# Patient Record
Sex: Female | Born: 1980 | Race: White | Hispanic: No | Marital: Married | Smoking: Never smoker
Health system: Southern US, Community
[De-identification: ages and names within clinical notes are randomized; demographics above are authoritative.]

## PROBLEM LIST (undated history)

## (undated) DIAGNOSIS — N83202 Unspecified ovarian cyst, left side: Secondary | ICD-10-CM

## (undated) DIAGNOSIS — F32A Depression, unspecified: Secondary | ICD-10-CM

## (undated) DIAGNOSIS — E669 Obesity, unspecified: Secondary | ICD-10-CM

## (undated) DIAGNOSIS — F329 Major depressive disorder, single episode, unspecified: Secondary | ICD-10-CM

## (undated) DIAGNOSIS — K219 Gastro-esophageal reflux disease without esophagitis: Secondary | ICD-10-CM

## (undated) HISTORY — DX: Gastro-esophageal reflux disease without esophagitis: K21.9

## (undated) HISTORY — DX: Unspecified ovarian cyst, left side: N83.202

## (undated) HISTORY — DX: Obesity, unspecified: E66.9

## (undated) HISTORY — DX: Depression, unspecified: F32.A

## (undated) HISTORY — DX: Major depressive disorder, single episode, unspecified: F32.9

---

## 2004-08-29 ENCOUNTER — Ambulatory Visit: Payer: Self-pay

## 2007-11-21 ENCOUNTER — Ambulatory Visit (HOSPITAL_COMMUNITY): Admission: RE | Admit: 2007-11-21 | Discharge: 2007-11-21 | Payer: Self-pay | Admitting: Obstetrics and Gynecology

## 2008-04-07 ENCOUNTER — Ambulatory Visit: Payer: Self-pay

## 2008-06-18 IMAGING — US US OB < 14 WEEKS
1 series · 14 of 21 positions shown · non-contrast
Comparison: none

REASON FOR EXAM: Dates Viablility Call Report [id number]
COMMENTS:

[Series 1: us ob < 14 weeks · 0.29mm/px · 14 of 21 slices shown]
[im 1/21]
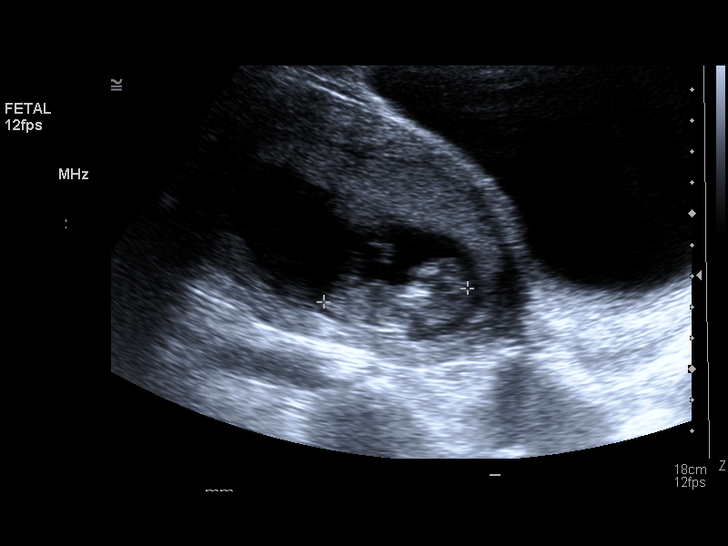
[im 3/21]
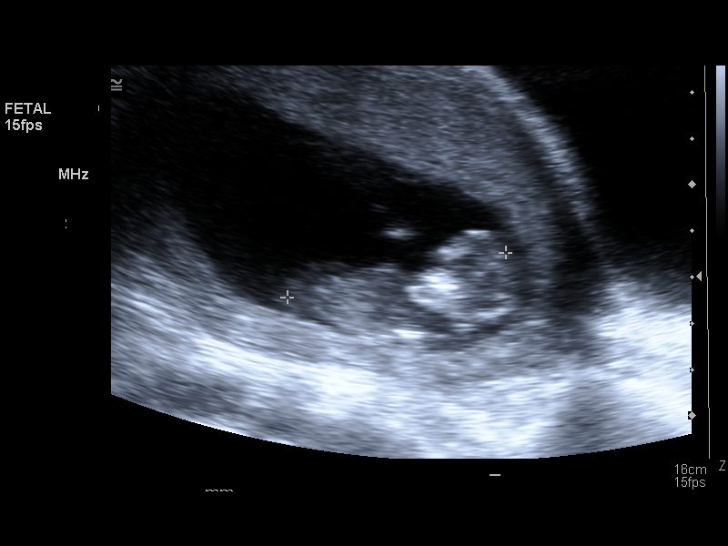
[im 4/21]
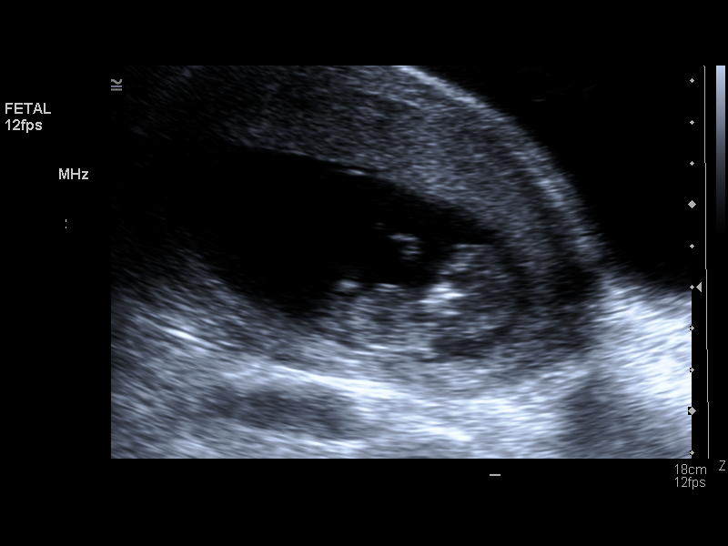
[im 6/21]
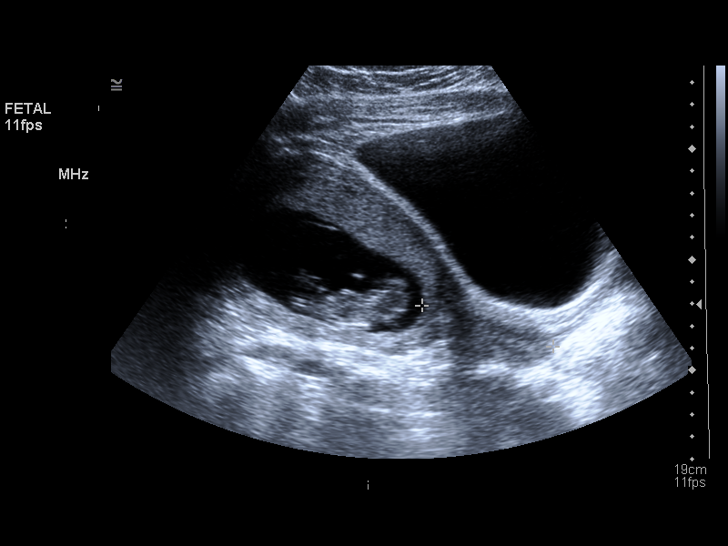
[im 7/21]
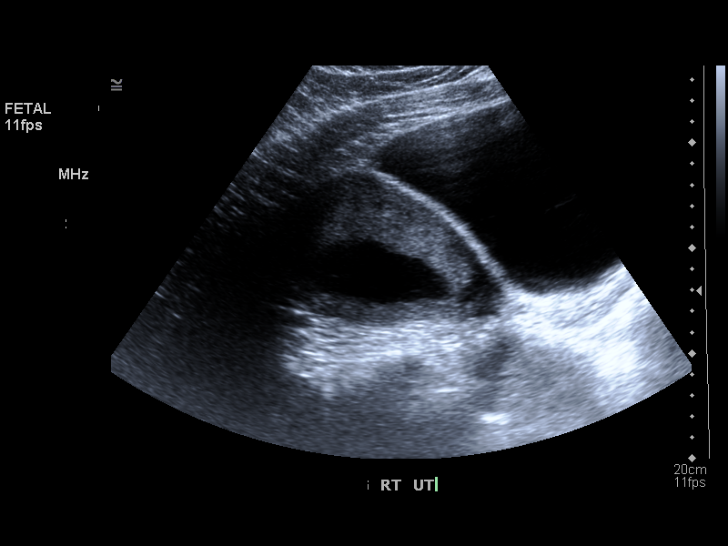
[im 9/21]
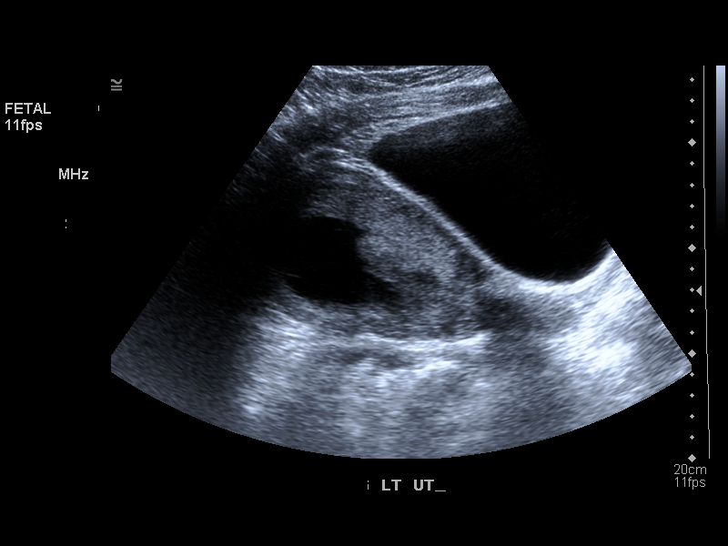
[im 10/21]
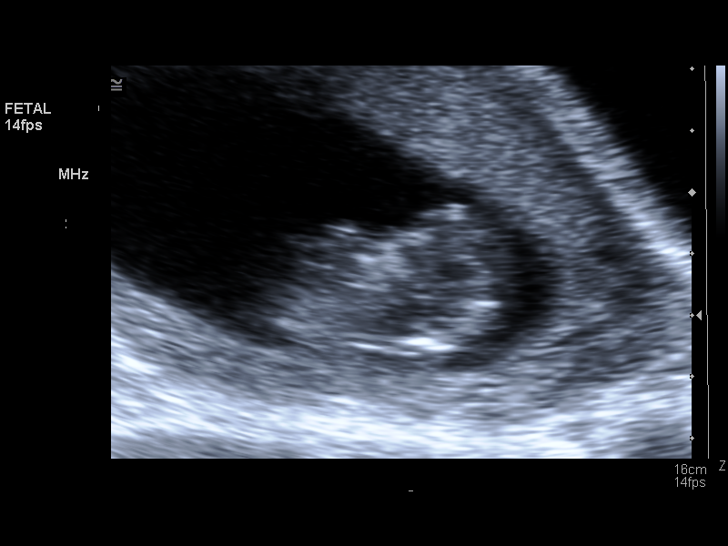
[im 12/21]
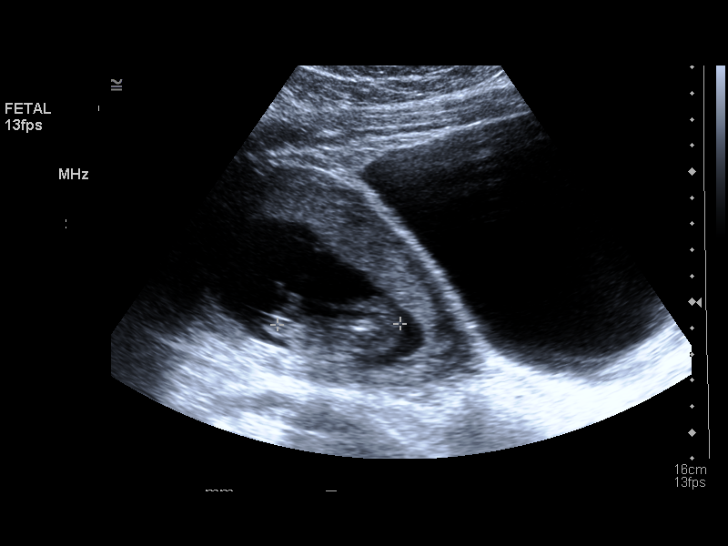
[im 13/21]
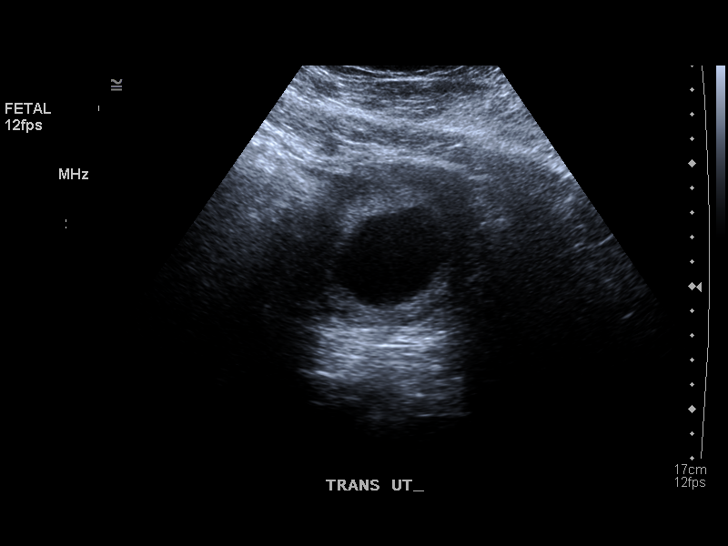
[im 15/21]
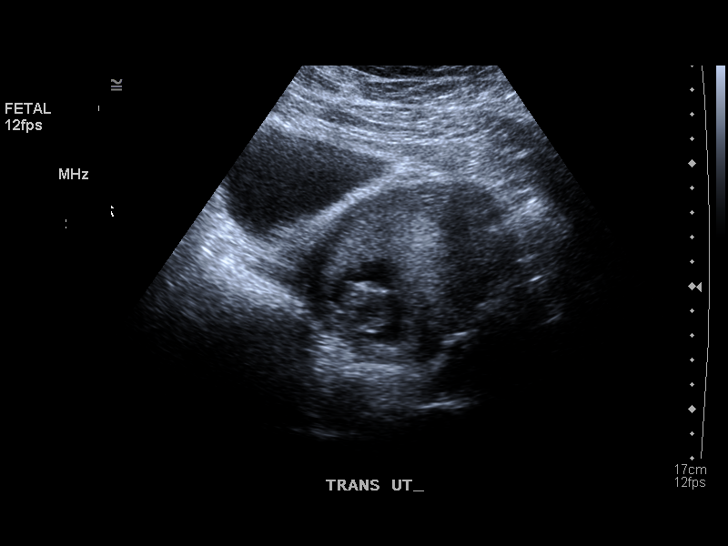
[im 16/21]
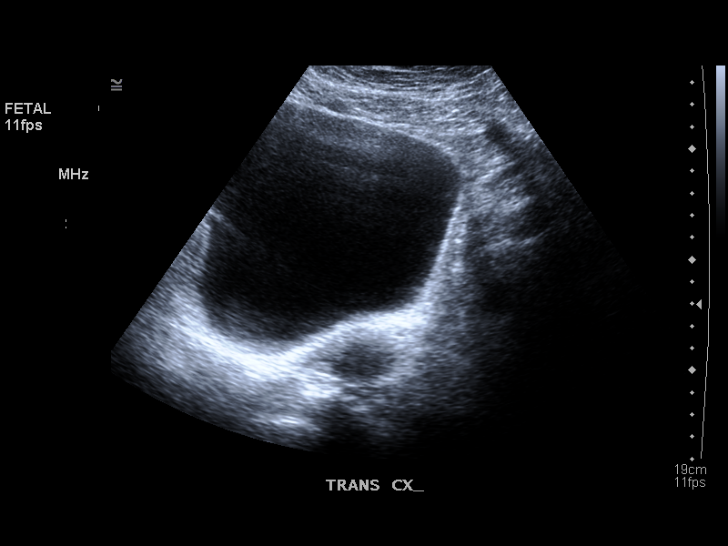
[im 18/21]
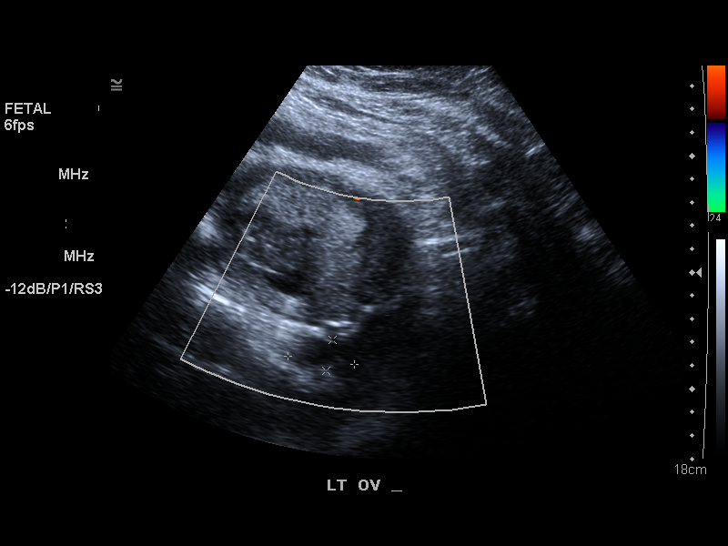
[im 19/21]
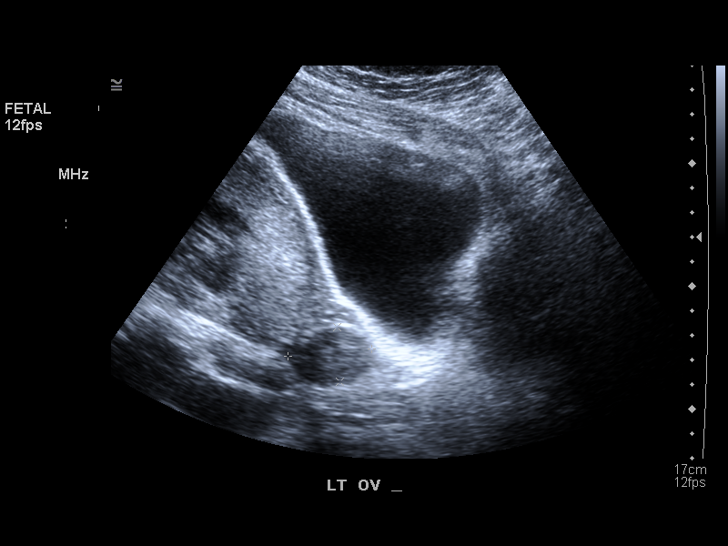
[im 21/21]
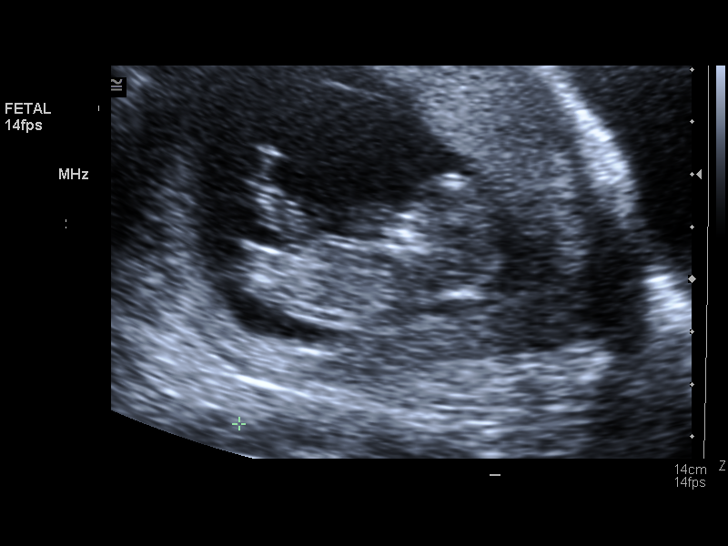

[14 of 21 positions shown; findings below may reference images not displayed]

PROCEDURE:     US  - US OB LESS THAN 14 WEEKS  - April 07, 2008  [DATE]

RESULT:     There is a gravid uterus present. The crown-rump length measures
47.3 cm corresponding to a gestational age of 66weeks-4 days. Fetal cardiac
activity with a rate of 163 beats per minute is seen. The ovaries are normal
in echotexture and contour.
IMPRESSION: There is a viable IUP with estimated gestational age of 11
weeks-3 days. The estimated date of confinement is 23 October, 2008. Cardiac
activity with a rate of 163 beats per minute was demonstrated.

This report was called to Ms. [HOSPITAL] at the conclusion of the
study.

## 2008-10-13 ENCOUNTER — Observation Stay: Payer: Self-pay | Admitting: Obstetrics and Gynecology

## 2008-10-15 ENCOUNTER — Observation Stay: Payer: Self-pay | Admitting: Obstetrics and Gynecology

## 2008-10-26 ENCOUNTER — Inpatient Hospital Stay: Payer: Self-pay

## 2010-08-24 ENCOUNTER — Ambulatory Visit: Payer: Self-pay | Admitting: Internal Medicine

## 2010-11-04 IMAGING — US US EXTREM LOW VENOUS*L*
1 series · 17 of 24 positions shown · non-contrast
Comparison: none

REASON FOR EXAM: cr  [id number]  left leg pain edema  eval for DVT
COMMENTS:

[Series 1: us extrem low venous*left* · 17 of 25 slices shown]
[im 1/25]
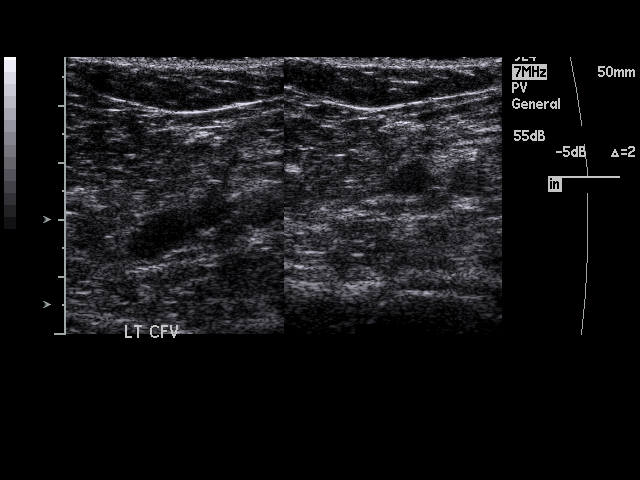
[im 3/25]
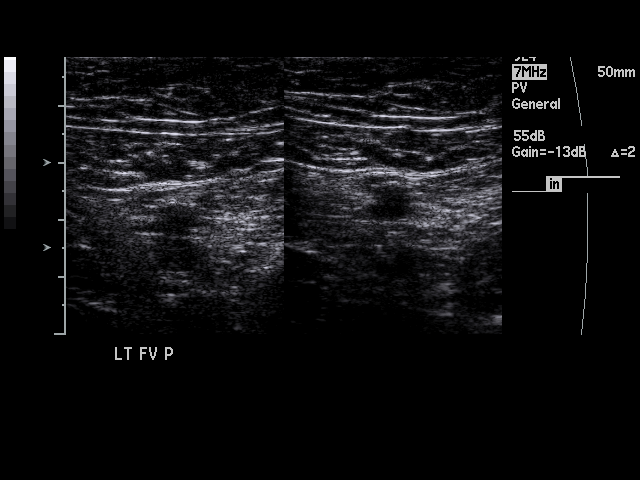
[im 4/25]
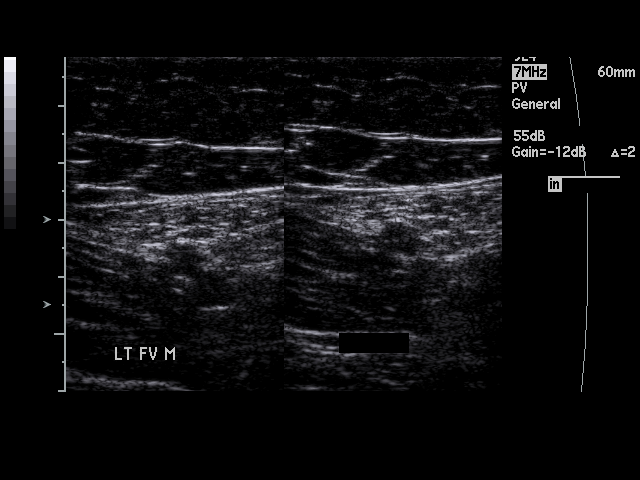
[im 5/25]
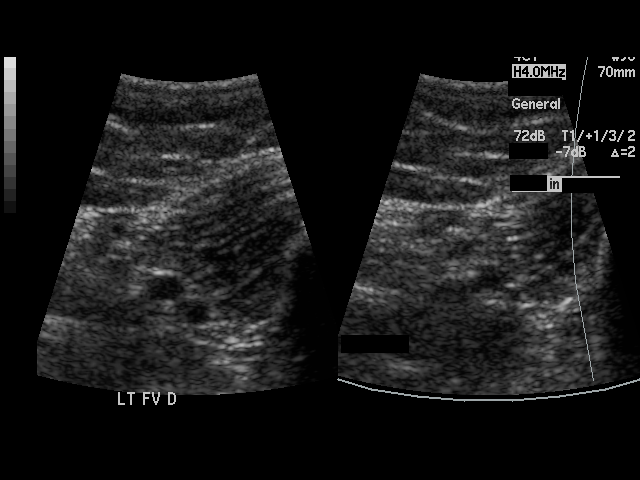
[im 7/25]
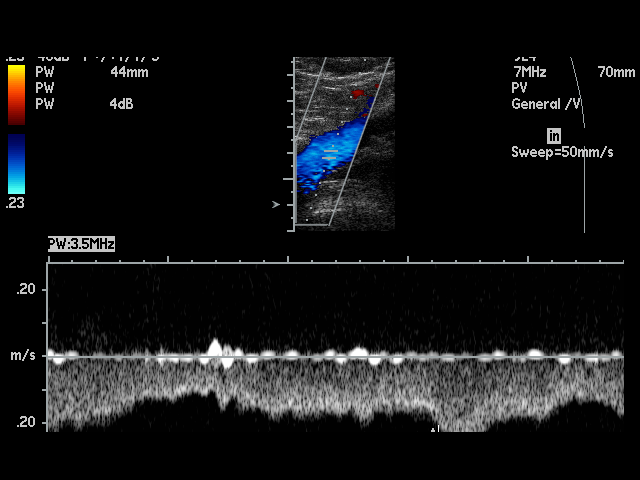
[im 8/25]
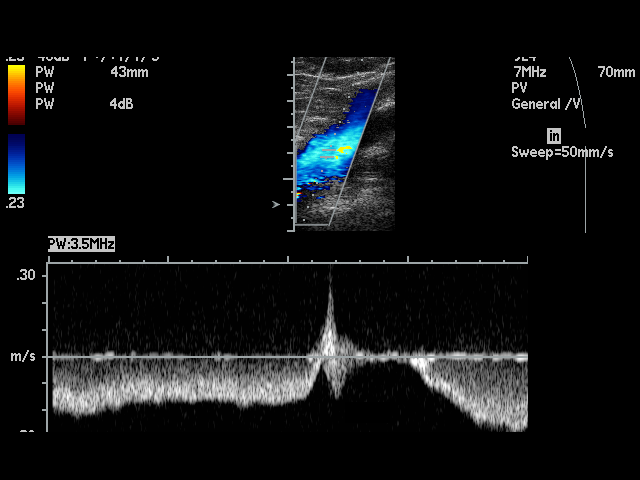
[im 10/25]
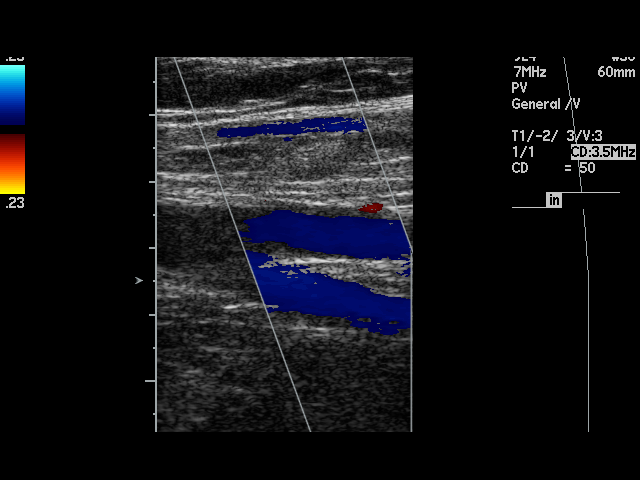
[im 11/25]
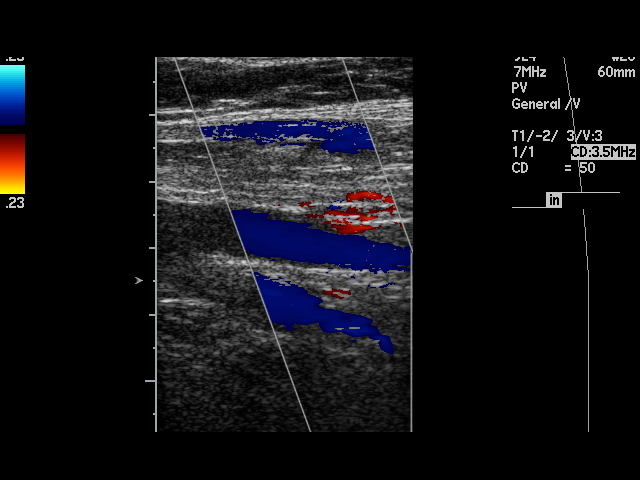
[im 13/25]
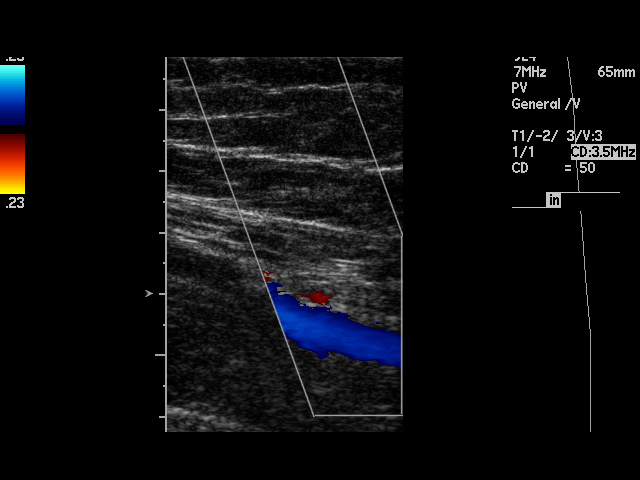
[im 14/25]
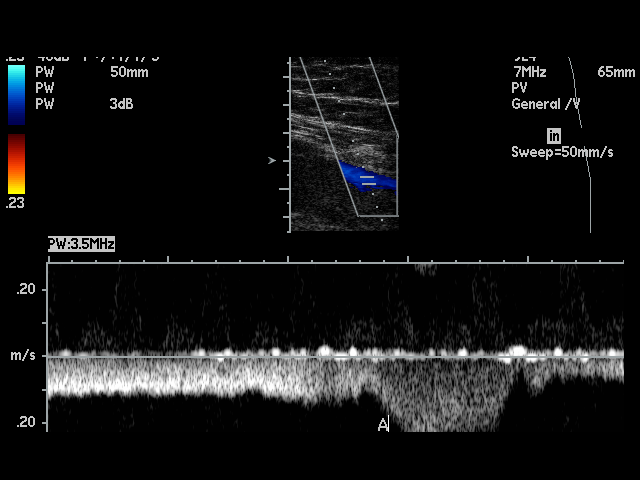
[im 15/25]
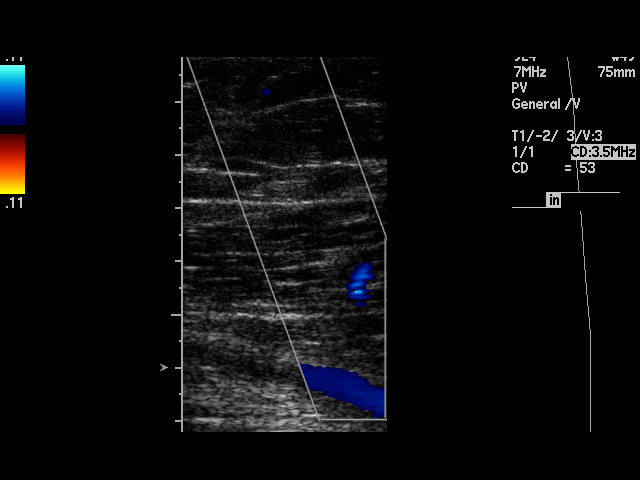
[im 17/25]
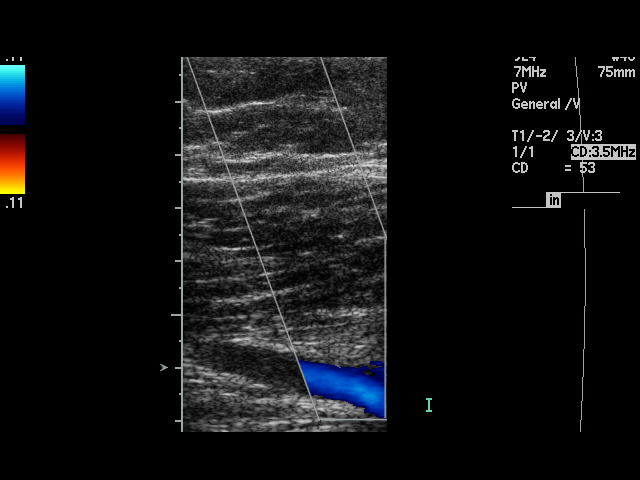
[im 18/25]
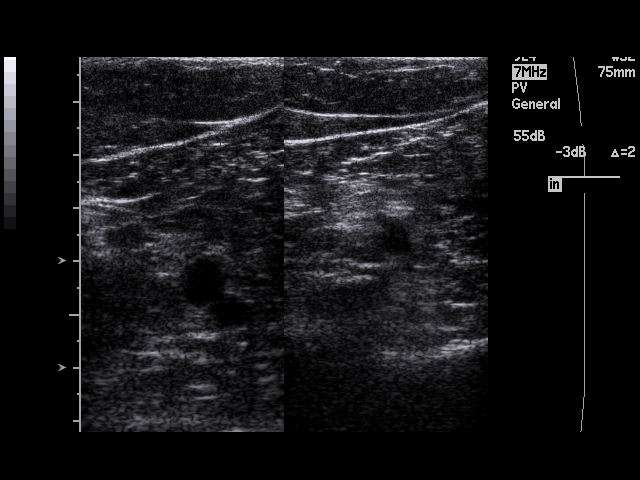
[im 20/25]
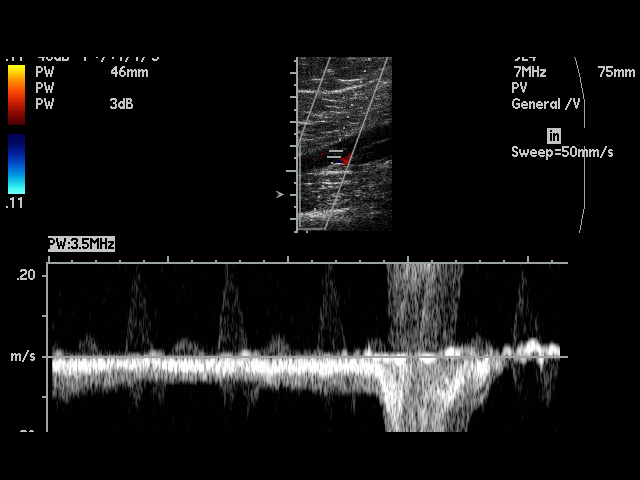
[im 21/25]
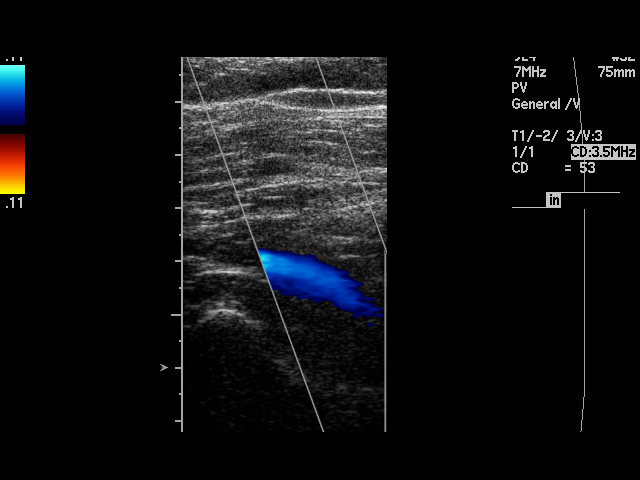
[im 22/25]
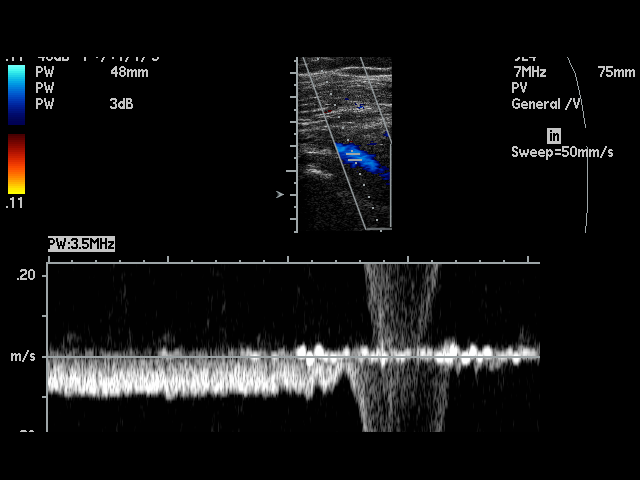
[im 25/25]
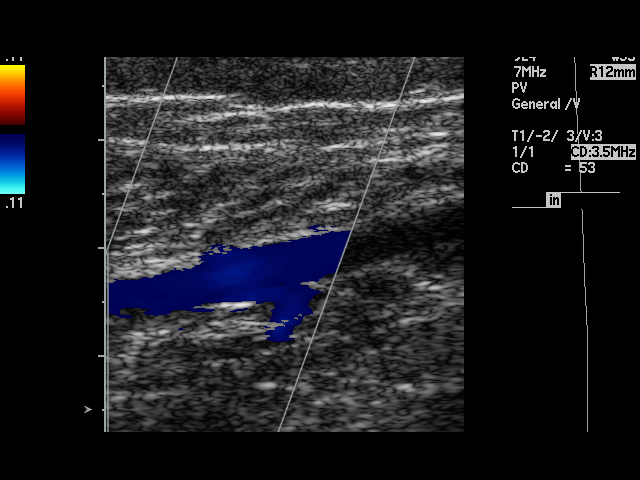

[17 of 24 positions shown; findings below may reference images not displayed]

PROCEDURE:     NOMASIBULELE - NOMASIBULELE DOPPLER VEINS LT LEG EXTR  - August 24, 2010  [DATE]

RESULT:     Duplex Doppler interrogation of the deep venous system of both
legs from the inguinal to the popliteal region demonstrates the deep venous
systems are fully compressible throughout. The color Doppler and spectral
Doppler appearance is normal. There is normal response to distal
augmentation. The color Doppler images show no filling defect.
IMPRESSION: 1. No evidence of DVT in either lower extremity.

## 2015-02-18 LAB — HM PAP SMEAR: HM PAP: NEGATIVE

## 2015-04-13 ENCOUNTER — Other Ambulatory Visit: Payer: Self-pay | Admitting: Obstetrics and Gynecology

## 2015-04-13 DIAGNOSIS — N83209 Unspecified ovarian cyst, unspecified side: Secondary | ICD-10-CM

## 2015-04-26 ENCOUNTER — Encounter: Payer: Self-pay | Admitting: *Deleted

## 2015-04-29 ENCOUNTER — Ambulatory Visit: Payer: 59

## 2015-04-29 ENCOUNTER — Encounter: Payer: Self-pay | Admitting: Obstetrics and Gynecology

## 2015-04-29 ENCOUNTER — Ambulatory Visit (INDEPENDENT_AMBULATORY_CARE_PROVIDER_SITE_OTHER): Payer: 59 | Admitting: Obstetrics and Gynecology

## 2015-04-29 VITALS — BP 134/80 | HR 67 | Ht 69.0 in | Wt 266.0 lb

## 2015-04-29 DIAGNOSIS — N970 Female infertility associated with anovulation: Secondary | ICD-10-CM | POA: Diagnosis not present

## 2015-04-29 DIAGNOSIS — N83209 Unspecified ovarian cyst, unspecified side: Secondary | ICD-10-CM

## 2015-04-29 MED ORDER — CLOMIPHENE CITRATE 50 MG PO TABS: 50.0000 mg | ORAL_TABLET | Freq: Every day | ORAL | Status: DC

## 2015-04-29 NOTE — Patient Instructions (Signed)
CLOMID INSTRUCTIONS  WHY USE IT? Clomid helps your ovaries to release eggs (ovulate).  HOW TO USE IT? Clomid is taken as a pill usually on days 5,6,7,8, & 9 of your cycle.  Day 1 is the first day of your period. The dose or duration may be changed to achieve ovulation.  Provera (progesterone) may first be used to bring on a period for some patients. The day of ovulation on Clomid is usually between cycle day 14 and 17.  Having sexual intercourse at least every other day between cycle day 13 and 18 will improve your chances of becoming pregnant during the Clomid cycle.  You may monitor your ovulation using basal body temperature charts or with ovulation kits.  If using the ovulation predictor kits, having intercourse the day of the surge and the two days following is recommended. If you get your period, call when it starts for an appointment with your doctor, so that an exam may be done, and another Clomid cycle can be considered if appropriate. If you do not get a period by day 35 of the cycle, please get a blood pregnancy test.  If it is negative, speak to your doctor for instructions to bring on another period and to plan a follow-up appointment.  THINGS TO KNOW: If you get pregnant while using Clomid, your chance of twins is 7%m and triplets is less than 1%. Some studies have suggested the use of "fertility drugs" may increase your risk of ovarian cancers in the future.  It is unclear if these drugs increase the risk, or people who have problems with fertility are prone for these cancers.  If there is an actual risk, it is very low.  If you have a history of liver problems or ovarian cancer, it may be wise to avoid this medication.  SIDE EFFECTS:  The most common side effect is hot flashes (20%).  Breast tenderness, headaches, nausea, bloating may also occur at different times.  Less than 3/1,000 people have dryness or loss of hair.  Persistent ovarian cysts may form from the use of this  medication.  Ovarian hyperstimulation syndrome is a rare side effect at low doses.  Visual changes like flashes of light or blurring.   Mariell Nester Elissa Lovett, CNM

## 2015-04-29 NOTE — Progress Notes (Signed)
Patient ID: Danielle Stewart, female   DOB: [birth year], 34 y.o.   MRN: [id number]  Here for follow-up u/s for ovarian cyst and infertility  S: Reports no pain or bleeding since last visit 6 weeks ago; just finished continuous OCPs for ovarian cyst suppression.   O: see scan u/s report- show resolution of ovarian cyst and otherwise normal uterine and ovarian findings  A: ovarian cyst resolved Infertility  P:  Restart clomid per policy, recheck in 3 months if pregnancy not achieved.  Actively work on weight loss  restart daily PNV  Danielle Stewart Danielle Stewart, CNM

## 2015-05-22 ENCOUNTER — Encounter: Payer: Self-pay | Admitting: Emergency Medicine

## 2015-05-22 ENCOUNTER — Emergency Department
Admission: EM | Admit: 2015-05-22 | Discharge: 2015-05-22 | Discharge status: Against Medical Advice | Payer: 59 | Attending: Emergency Medicine | Admitting: Emergency Medicine

## 2015-05-22 DIAGNOSIS — R109 Unspecified abdominal pain: Secondary | ICD-10-CM | POA: Insufficient documentation

## 2015-05-22 NOTE — ED Notes (Signed)
Had an episode of severe stomach pain - states ate [place] and had bm - then had the episode of severe pain. States feels somewhat better but has a pulling abd pain

## 2015-05-22 NOTE — ED Notes (Signed)
Pt decided that she didn't want to stay. Stated felt better and thought was gas. Advised she was more than welcome to wait and see doctor. Stated if the pain returned she would call doctor in morning. Declined a pregnancy test.

## 2015-08-02 ENCOUNTER — Encounter: Payer: Self-pay | Admitting: Obstetrics and Gynecology

## 2015-08-02 ENCOUNTER — Ambulatory Visit (INDEPENDENT_AMBULATORY_CARE_PROVIDER_SITE_OTHER): Payer: 59 | Admitting: Obstetrics and Gynecology

## 2015-08-02 VITALS — BP 127/84 | HR 68 | Ht 69.0 in | Wt 271.7 lb

## 2015-08-02 DIAGNOSIS — Z8742 Personal history of other diseases of the female genital tract: Secondary | ICD-10-CM | POA: Diagnosis not present

## 2015-08-02 DIAGNOSIS — E669 Obesity, unspecified: Secondary | ICD-10-CM

## 2015-08-02 MED ORDER — CLOMIPHENE CITRATE 50 MG PO TABS: 100.0000 mg | ORAL_TABLET | Freq: Every day | ORAL | Status: DC

## 2015-08-02 NOTE — Progress Notes (Signed)
Patient ID: Danielle Stewart, female   DOB: 1981-02-08, 34 y.o.   MRN: [id number]  Here to review medications and discuss achieving pregnancy  Taken 3rd month of clomid  and having menses q29-31 days, unsure wether she is ovulation or not, has lost 6 #.  Currently day 30, with negative home UPT 3 days ago. UPT here negative A: Morbid obesity  ClomidTyniya KuyperP: Increase clomid to  on days 5-9, RX sent in. RTC on day 20 of next menses for u/s and progesterone levels  Lindsay Soulliere Ines Bloomer, CNM

## 2015-08-24 ENCOUNTER — Other Ambulatory Visit: Payer: 59

## 2015-08-24 ENCOUNTER — Other Ambulatory Visit: Payer: Self-pay

## 2015-08-24 ENCOUNTER — Ambulatory Visit: Payer: 59

## 2015-08-24 DIAGNOSIS — Z8742 Personal history of other diseases of the female genital tract: Secondary | ICD-10-CM | POA: Diagnosis not present

## 2015-08-25 LAB — PROGESTERONE: Progesterone: 21.8 ng/mL

## 2015-08-26 ENCOUNTER — Telehealth: Payer: Self-pay | Admitting: Obstetrics and Gynecology

## 2015-08-26 NOTE — Telephone Encounter (Signed)
PT CALLED AND SHE HAD US ON Wednesday MORNING, AND SHE WANTED A CALL  WITH THE RESULTS.

## 2015-08-30 ENCOUNTER — Other Ambulatory Visit: Payer: Self-pay | Admitting: Obstetrics and Gynecology

## 2015-08-30 DIAGNOSIS — N83201 Unspecified ovarian cyst, right side: Secondary | ICD-10-CM

## 2015-10-11 ENCOUNTER — Ambulatory Visit (INDEPENDENT_AMBULATORY_CARE_PROVIDER_SITE_OTHER): Payer: 59 | Admitting: Obstetrics and Gynecology

## 2015-10-11 ENCOUNTER — Ambulatory Visit (INDEPENDENT_AMBULATORY_CARE_PROVIDER_SITE_OTHER): Payer: 59

## 2015-10-11 ENCOUNTER — Encounter: Payer: Self-pay | Admitting: Obstetrics and Gynecology

## 2015-10-11 VITALS — BP 131/91 | HR 87 | Wt 273.9 lb

## 2015-10-11 DIAGNOSIS — N83201 Unspecified ovarian cyst, right side: Secondary | ICD-10-CM

## 2015-10-11 DIAGNOSIS — Z09 Encounter for follow-up examination after completed treatment for conditions other than malignant neoplasm: Secondary | ICD-10-CM | POA: Diagnosis not present

## 2015-10-11 NOTE — Progress Notes (Signed)
Patient ID: Danielle Stewart, female   DOB: 07/04/1981, 34 y.o.   MRN: [id number]  Here for follow-up ultrasound for ovarian cysts in leu of trying for pregnancy with clomid use.  S: denies any pain or concerns  O: Indications:Follow up right ovarian cyst Findings:  The uterus measures 9.2 x 5 x 6.4 cm. Echo texture is homogenous without evidence of focal masses The Endometrium measures 5.6 mm.  Right Ovary measures 4.2 x 3 x 3.6 cm. It is normal in appearance. Left Ovary measures 3.7 x 2.7 x 2.2 cm. It is normal appearance. Survey of the adnexa demonstrates no adnexal masses. There is no free fluid in the cul de sac.  Impression: 1. The uterus and ovaries are within normal limits.  A:Right ovarian cyst resolved  P: to restart clomid at 100mg  as planned. If not pregnant in 4 months will consider more testing. RTC PRN.  Wyona Neils RushmoreShambley, CNM

## 2015-12-12 ENCOUNTER — Telehealth: Payer: Self-pay | Admitting: Obstetrics and Gynecology

## 2015-12-12 NOTE — Telephone Encounter (Signed)
PT IS OUT OF REFILLS OF CLOMID. SHE IS SUPPOSED TO COME BACK IN MARCH TO SEE YOU ANS WONDERED IF SHE SHOULD STILL TAKE THE CLOMID, AND DOES SHE NEED TO COME A CERTQIN TIME OF THE MONTH ACCORDING TO HER CYCLE?

## 2015-12-13 NOTE — Telephone Encounter (Signed)
pls advise

## 2015-12-14 ENCOUNTER — Other Ambulatory Visit: Payer: Self-pay | Admitting: Obstetrics and Gynecology

## 2015-12-14 MED ORDER — CLOMIPHENE CITRATE 50 MG PO TABS: 100.0000 mg | ORAL_TABLET | Freq: Every day | ORAL | Status: DC

## 2015-12-14 NOTE — Telephone Encounter (Signed)
Please let her know I sent in more refills to get her through till she can be seen in March. I would like to see her on/near day 20 of that cycle.

## 2015-12-15 NOTE — Telephone Encounter (Signed)
Notified pt she voiced understanding 

## 2016-02-22 ENCOUNTER — Encounter: Payer: 59 | Admitting: Obstetrics and Gynecology

## 2016-02-23 ENCOUNTER — Ambulatory Visit (INDEPENDENT_AMBULATORY_CARE_PROVIDER_SITE_OTHER): Payer: 59 | Admitting: Obstetrics and Gynecology

## 2016-02-23 ENCOUNTER — Encounter: Payer: Self-pay | Admitting: Obstetrics and Gynecology

## 2016-02-23 VITALS — BP 141/89 | HR 83 | Ht 69.0 in | Wt 280.7 lb

## 2016-02-23 DIAGNOSIS — Z01419 Encounter for gynecological examination (general) (routine) without abnormal findings: Secondary | ICD-10-CM

## 2016-02-23 NOTE — Patient Instructions (Signed)
Place annual gynecologic exam patient instructions here.

## 2016-02-23 NOTE — Progress Notes (Signed)
Subjective:   Danielle BlaseJamie Stewart is a 35 y.o. 582P1011 Caucasian female here for a routine well-woman exam.  Patient's last menstrual period was 02/10/2016.    Current complaints: none, stopped clomid- is taking a break PCP: me       doesn't desire labs  Social History: Sexual: heterosexual Marital Status: married Living situation: with family Occupation: unknown occupation Tobacco/alcohol: no tobacco use Illicit drugs: no history of illicit drug use  The following portions of the patient's history were reviewed and updated as appropriate: allergies, current medications, past family history, past medical history, past social history, past surgical history and problem list.  Past Medical History Past Medical History  Diagnosis Date  . GERD (gastroesophageal reflux disease)   . Obesity   . Left ovarian cyst   . Depression     Past Surgical History History reviewed. No pertinent past surgical history.  Gynecologic History G2P1011  Patient's last menstrual period was 02/10/2016. Contraception: none Last Pap: 2016. Results were: normal   Obstetric History OB History  Gravida Para Term Preterm AB SAB TAB Ectopic Multiple Living  2 1 1  1 1    1     # Outcome Date GA Lbr Len/2nd Weight Sex Delivery Anes PTL Lv  2 Term 2009    F Vag-Spont   Y  1 SAB               Current Medications Current Outpatient Prescriptions on File Prior to Visit  Medication Sig Dispense Refill  . clomiPHENE (CLOMID) 50 MG tablet Take 2 tablets (100 mg total) by mouth daily. Take on days 5-9 of your period (Patient not taking: Reported on 02/23/2016) 10 tablet 3   No current facility-administered medications on file prior to visit.    Review of Systems Patient denies any headaches, blurred vision, shortness of breath, chest pain, abdominal pain, problems with bowel movements, urination, or intercourse.  Objective:  BP 141/89 mmHg  Pulse 83  Ht 5\' 9"  (1.753 m)  Wt 280 lb 11.2 oz (127.325 kg)  BMI  41.43 kg/m2  LMP 02/10/2016 Physical Exam  General:  Well developed, well nourished, no acute distress. She is alert and oriented x3. Skin:  Warm and dry Neck:  Midline trachea, no thyromegaly or nodules Cardiovascular: Regular rate and rhythm, no murmur heard Lungs:  Effort normal, all lung fields clear to auscultation bilaterally Breasts:  No dominant palpable mass, retraction, or nipple discharge Abdomen:  Soft, non tender, no hepatosplenomegaly or masses Pelvic:  External genitalia is normal in appearance.  The vagina is normal in appearance. The cervix is bulbous, no CMT.  Thin prep pap is not done . Uterus is felt to be normal size, shape, and contour.  No adnexal masses or tenderness noted. Extremities:  No swelling or varicosities noted Psych:  She has a normal mood and affect  Assessment:   Healthy well-woman exam Morbid obesity H/o depression Plan:  Encouraged daily PNV use in leu of possible pregnancy F/U 1 year for AE, or sooner if needed   Melody Suzan NailerN Shambley, CNM

## 2016-11-16 ENCOUNTER — Encounter: Payer: Self-pay | Admitting: Obstetrics and Gynecology

## 2016-11-16 ENCOUNTER — Other Ambulatory Visit (INDEPENDENT_AMBULATORY_CARE_PROVIDER_SITE_OTHER): Payer: 59

## 2016-11-16 ENCOUNTER — Ambulatory Visit (INDEPENDENT_AMBULATORY_CARE_PROVIDER_SITE_OTHER): Payer: 59 | Admitting: Obstetrics and Gynecology

## 2016-11-16 ENCOUNTER — Other Ambulatory Visit: Payer: 59

## 2016-11-16 VITALS — BP 138/69 | HR 81 | Ht 69.0 in | Wt 288.3 lb

## 2016-11-16 DIAGNOSIS — Z3169 Encounter for other general counseling and advice on procreation: Secondary | ICD-10-CM

## 2016-11-16 NOTE — Progress Notes (Signed)
Subjective:     Patient ID: Danielle Stewart, female   DOB: 02-19-1981, 36 y.o.   MRN: [id number]  HPI Still desires pregnancy, menses are regular and occurring every 31-33 days, ovulation kit shows day 18-20, with h/o hemorrhagic ovarian cyst in past. G1P1 (child is 13yo). Menses last 4-5 days, with heavy bleeding. Also concerned about weight gain.  Review of Systems Negative except stated above    Objective:   Physical Exam     Indications: Infertility check, look at follicles Findings:  The uterus is anteverted and measures 10.2 x 5.3 x 6.5 cm. Echo texture is homogenous without evidence of focal masses.  The Endometrium appears to be in the periovulatory phase measuring 13.6 mm.  Right Ovary measures 4.6 x 3.1 x 3.4 cm. There is a 2.1 cm dominant follicle seen, otherwise, WNL. Left Ovary measures 3.4 x 2.8 x 2.2 cm. There are at least 7-8 subcentimeter follicles seen and the ovary appears WNL. Survey of the adnexa demonstrates no adnexal masses. There is no free fluid in the cul de sac.  Impression: 1. Periovulatory uterus with dominant right ovarian follicle.  Assessment:     Infertility     Plan:     Will wait for progesterone levels and if confirmatory, will consider restarting clomid.  Melody Panama, CNM

## 2016-11-17 LAB — PROGESTERONE: Progesterone: 5.9 ng/mL

## 2016-11-21 ENCOUNTER — Telehealth: Payer: Self-pay | Admitting: Obstetrics and Gynecology

## 2016-11-21 NOTE — Telephone Encounter (Signed)
PT HAD LABS AND US AND WAS CALLING TO SEE IF MEL HAD REVIEWED THE RESULTS. PLEASE GIVE PT A CALL

## 2016-11-21 NOTE — Telephone Encounter (Signed)
pls advise

## 2016-11-23 ENCOUNTER — Other Ambulatory Visit: Payer: Self-pay | Admitting: Obstetrics and Gynecology

## 2016-11-23 MED ORDER — CLOMIPHENE CITRATE 50 MG PO TABS: 50.0000 mg | ORAL_TABLET | Freq: Every day | ORAL | 3 refills | Status: DC

## 2016-11-23 NOTE — Telephone Encounter (Signed)
Answered questions, restarted on Clomid

## 2017-06-04 ENCOUNTER — Encounter: Payer: Self-pay | Admitting: Obstetrics and Gynecology

## 2017-06-04 ENCOUNTER — Ambulatory Visit (INDEPENDENT_AMBULATORY_CARE_PROVIDER_SITE_OTHER): Payer: 59 | Admitting: Obstetrics and Gynecology

## 2017-06-04 VITALS — BP 155/91 | HR 75 | Ht 69.0 in | Wt 279.2 lb

## 2017-06-04 DIAGNOSIS — Z3169 Encounter for other general counseling and advice on procreation: Secondary | ICD-10-CM | POA: Diagnosis not present

## 2017-06-04 DIAGNOSIS — Z01419 Encounter for gynecological examination (general) (routine) without abnormal findings: Secondary | ICD-10-CM | POA: Diagnosis not present

## 2017-06-04 NOTE — Progress Notes (Signed)
Subjective:   Danielle Stewart is a 36 y.o. 42P1011 Caucasian female here for a routine well-woman exam.  Patient's last menstrual period was 05/27/2017.    Current complaints: frustrated over weight and infertility- now 5 years of trying to get pregnant again. PCP: me       doesn't desire labs  Social History: Sexual: heterosexual Marital Status: married Living situation: with family Occupation: unknown occupation Tobacco/alcohol: no tobacco use Illicit drugs: no history of illicit drug use  The following portions of the patient's history were reviewed and updated as appropriate: allergies, current medications, past family history, past medical history, past social history, past surgical history and problem list.  Past Medical History Past Medical History:  Diagnosis Date  . Depression   . GERD (gastroesophageal reflux disease)   . Left ovarian cyst   . Obesity     Past Surgical History History reviewed. No pertinent surgical history.  Gynecologic History G2P1011  Patient's last menstrual period was 05/27/2017. Contraception: none Last Pap: 2016. Results were: normal   Obstetric History OB History  Gravida Para Term Preterm AB Living  2 1 1   1 1   SAB TAB Ectopic Multiple Live Births  1       1    # Outcome Date GA Lbr Len/2nd Weight Sex Delivery Anes PTL Lv  2 Term 2009    F Vag-Spont   LIV  1 SAB               Current Medications Current Outpatient Prescriptions on File Prior to Visit  Medication Sig Dispense Refill  . clomiPHENE (CLOMID) 50 MG tablet Take 1 tablet (50 mg total) by mouth daily. Take on days 5-9 of your period (Patient not taking: Reported on 06/04/2017) 5 tablet 3   No current facility-administered medications on file prior to visit.     Review of Systems Patient denies any headaches, blurred vision, shortness of breath, chest pain, abdominal pain, problems with bowel movements, urination, or intercourse.  Objective:  BP (!) 155/91   Pulse  75   Ht 5\' 9"  (1.753 m)   Wt 279 lb 3.2 oz (126.6 kg)   LMP 05/27/2017   BMI 41.23 kg/m  Physical Exam  General:  Well developed, well nourished, no acute distress. She is alert and oriented x3. Skin:  Warm and dry Neck:  Midline trachea, no thyromegaly or nodules Cardiovascular: Regular rate and rhythm, no murmur heard Lungs:  Effort normal, all lung fields clear to auscultation bilaterally Breasts:  No dominant palpable mass, retraction, or nipple discharge Abdomen:  Soft, non tender, no hepatosplenomegaly or masses Pelvic:  External genitalia is normal in appearance.  The vagina is normal in appearance. The cervix is bulbous, no CMT.  Thin prep pap is not done . Uterus is felt to be normal size, shape, and contour.  No adnexal masses or tenderness noted. Extremities:  No swelling or varicosities noted Psych:  She has a normal mood and affect  Assessment:   Healthy well-woman exam Morbid obesity infertility  Plan:  Desires trial of weight loss program- counseled and will have her return to start when she is ready. Declined referral to fertility clinic. F/U 1 year for AE, or sooner if needed   Danielle Stewart Danielle NailerN Danielle Stewart, CNM

## 2017-07-03 ENCOUNTER — Ambulatory Visit (INDEPENDENT_AMBULATORY_CARE_PROVIDER_SITE_OTHER): Payer: 59 | Admitting: Obstetrics and Gynecology

## 2017-07-03 VITALS — BP 136/84 | HR 80 | Wt 269.0 lb

## 2017-07-03 DIAGNOSIS — E663 Overweight: Secondary | ICD-10-CM | POA: Diagnosis not present

## 2017-07-03 MED ORDER — CYANOCOBALAMIN 1000 MCG/ML IJ SOLN
1000.0000 ug | Freq: Once | INTRAMUSCULAR | Status: AC
  Administered 2017-07-03: 1000 ug via INTRAMUSCULAR

## 2017-07-03 NOTE — Progress Notes (Signed)
Pt is here for wt, bp check, b-12 inj, she is doing well, denies any s/e, very proud of her weight loss   07/03/17 wt- 269lb 06/04/17 wt- 279lb

## 2017-07-29 ENCOUNTER — Ambulatory Visit (INDEPENDENT_AMBULATORY_CARE_PROVIDER_SITE_OTHER): Payer: 59 | Admitting: Obstetrics and Gynecology

## 2017-07-29 ENCOUNTER — Encounter: Payer: Self-pay | Admitting: Obstetrics and Gynecology

## 2017-07-29 VITALS — BP 132/82 | HR 89 | Ht 69.0 in | Wt 262.6 lb

## 2017-07-29 DIAGNOSIS — E663 Overweight: Secondary | ICD-10-CM

## 2017-07-29 MED ORDER — CYANOCOBALAMIN 1000 MCG/ML IJ SOLN
1000.0000 ug | Freq: Once | INTRAMUSCULAR | Status: AC
  Administered 2017-07-29: 1000 ug via INTRAMUSCULAR

## 2017-07-29 NOTE — Progress Notes (Signed)
Pt presents for wt, bp and b12. NO s/e. Wt is down 7#. Pt is exercising but not has much as she would like d/t work and home life. Advised pop fitness web site for 30 minute workouts at home. F/u with [facility]NS in 4 weeks.

## 2017-08-12 ENCOUNTER — Other Ambulatory Visit: Payer: Self-pay | Admitting: *Deleted

## 2017-08-12 ENCOUNTER — Telehealth: Payer: Self-pay | Admitting: Obstetrics and Gynecology

## 2017-08-12 MED ORDER — HYDROCORTISONE ACE-PRAMOXINE 2.5-1 % RE CREA: 1.0000 "application " | TOPICAL_CREAM | Freq: Three times a day (TID) | RECTAL | 0 refills | Status: DC

## 2017-08-12 NOTE — Telephone Encounter (Signed)
The medication that she was given is causing severe constipation and hemmroids she needs something to help her - she can barely sit down. Please call

## 2017-08-12 NOTE — Telephone Encounter (Signed)
Sent in some medication for hemorrhoids

## 2017-09-02 ENCOUNTER — Ambulatory Visit (INDEPENDENT_AMBULATORY_CARE_PROVIDER_SITE_OTHER): Payer: 59 | Admitting: Obstetrics and Gynecology

## 2017-09-02 ENCOUNTER — Encounter: Payer: Self-pay | Admitting: Obstetrics and Gynecology

## 2017-09-02 VITALS — BP 128/88 | HR 98 | Wt 261.7 lb

## 2017-09-02 DIAGNOSIS — E663 Overweight: Secondary | ICD-10-CM | POA: Diagnosis not present

## 2017-09-02 MED ORDER — CYANOCOBALAMIN 1000 MCG/ML IJ SOLN
1000.0000 ug | Freq: Once | INTRAMUSCULAR | Status: AC
  Administered 2017-09-02: 1000 ug via INTRAMUSCULAR

## 2017-09-02 NOTE — Progress Notes (Signed)
Pt is here for wt, bp check,b-12 inj she is doing well, denies any s/e  09/02/17 wt- 261.7lb 07/29/17 wt- 262lb

## 2017-09-30 ENCOUNTER — Ambulatory Visit: Payer: 59 | Admitting: Obstetrics and Gynecology

## 2017-09-30 ENCOUNTER — Encounter: Payer: Self-pay | Admitting: Obstetrics and Gynecology

## 2017-09-30 MED ORDER — PHENTERMINE HCL 37.5 MG PO TABS: 37.5000 mg | ORAL_TABLET | Freq: Every day | ORAL | 2 refills | Status: DC

## 2017-09-30 NOTE — Progress Notes (Signed)
  Pt is here to restart her phentermine, she is doing well, has been without her medication for several weeks-desires refill

## 2017-10-31 ENCOUNTER — Ambulatory Visit (INDEPENDENT_AMBULATORY_CARE_PROVIDER_SITE_OTHER): Payer: 59 | Admitting: Obstetrics and Gynecology

## 2017-10-31 ENCOUNTER — Encounter: Payer: Self-pay | Admitting: Obstetrics and Gynecology

## 2017-10-31 VITALS — BP 120/76 | HR 76 | Wt 260.3 lb

## 2017-10-31 DIAGNOSIS — E669 Obesity, unspecified: Secondary | ICD-10-CM | POA: Diagnosis not present

## 2017-10-31 MED ORDER — CYANOCOBALAMIN 1000 MCG/ML IJ SOLN
1000.0000 ug | Freq: Once | INTRAMUSCULAR | Status: AC
  Administered 2017-10-31: 1000 ug via INTRAMUSCULAR

## 2017-10-31 NOTE — Progress Notes (Signed)
Pt is here for wt, bp check, b-12 inj She is doing well, states she stopped her phentermine due to constipation, is going to start a stool softner  10/31/17 wt- 260.3lb 09/30/17 wt- 259lb

## 2017-12-02 ENCOUNTER — Ambulatory Visit (INDEPENDENT_AMBULATORY_CARE_PROVIDER_SITE_OTHER): Payer: Managed Care, Other (non HMO) | Admitting: Obstetrics and Gynecology

## 2017-12-02 ENCOUNTER — Encounter: Payer: Self-pay | Admitting: Obstetrics and Gynecology

## 2017-12-02 VITALS — BP 128/88 | HR 98 | Wt 261.7 lb

## 2017-12-02 DIAGNOSIS — E669 Obesity, unspecified: Secondary | ICD-10-CM | POA: Diagnosis not present

## 2017-12-02 MED ORDER — CYANOCOBALAMIN 1000 MCG/ML IJ SOLN
1000.0000 ug | Freq: Once | INTRAMUSCULAR | Status: AC
  Administered 2017-12-02 (×2): 1000 ug via INTRAMUSCULAR

## 2017-12-02 NOTE — Progress Notes (Signed)
Pt is here for wt, bp check, b-12 inj She hasnt been taking her phentermine due to constipation, we discussed her taking a stool softner and restarting her medication  12/02/17 wt-261.7lb 10/31/17 wt- 226lb

## 2017-12-30 ENCOUNTER — Ambulatory Visit (INDEPENDENT_AMBULATORY_CARE_PROVIDER_SITE_OTHER): Payer: Managed Care, Other (non HMO) | Admitting: Certified Nurse Midwife

## 2017-12-30 MED ORDER — CYANOCOBALAMIN 1000 MCG/ML IJ SOLN: 1000.0000 ug | Freq: Once | INTRAMUSCULAR | Status: DC

## 2017-12-30 NOTE — Progress Notes (Signed)
Pt is here for wt, bp check, b-12 inj She is doing well, she isnt taking her phentermine qd due to constipation  12/30/17 wt- 261.3lb 12/02/17 wt- 261lb

## 2018-01-24 ENCOUNTER — Telehealth: Payer: Self-pay | Admitting: Obstetrics and Gynecology

## 2018-01-24 NOTE — Telephone Encounter (Signed)
Pt requests call; wants to discuss her weigh ins and the medication she is on.

## 2018-01-27 ENCOUNTER — Ambulatory Visit: Payer: Managed Care, Other (non HMO) | Admitting: Obstetrics and Gynecology

## 2018-01-27 ENCOUNTER — Other Ambulatory Visit: Payer: Self-pay | Admitting: *Deleted

## 2018-01-27 MED ORDER — PHENTERMINE HCL 37.5 MG PO CAPS: 37.5000 mg | ORAL_CAPSULE | ORAL | 2 refills | Status: DC

## 2018-01-27 NOTE — Telephone Encounter (Signed)
Pt is wanting a refill on phentermine

## 2018-08-12 ENCOUNTER — Encounter

## 2018-08-12 ENCOUNTER — Ambulatory Visit (INDEPENDENT_AMBULATORY_CARE_PROVIDER_SITE_OTHER): Payer: Managed Care, Other (non HMO) | Admitting: Obstetrics and Gynecology

## 2018-08-12 ENCOUNTER — Encounter: Payer: Self-pay | Admitting: Obstetrics and Gynecology

## 2018-08-12 ENCOUNTER — Other Ambulatory Visit (HOSPITAL_COMMUNITY)
Admission: RE | Admit: 2018-08-12 | Discharge: 2018-08-12 | Disposition: A | Discharge status: Home / Self Care | Payer: Managed Care, Other (non HMO) | Source: Ambulatory Visit | Attending: Obstetrics and Gynecology | Admitting: Obstetrics and Gynecology

## 2018-08-12 VITALS — BP 126/81 | HR 76 | Ht 69.0 in | Wt 272.2 lb

## 2018-08-12 DIAGNOSIS — Z23 Encounter for immunization: Secondary | ICD-10-CM

## 2018-08-12 DIAGNOSIS — Z01419 Encounter for gynecological examination (general) (routine) without abnormal findings: Secondary | ICD-10-CM | POA: Insufficient documentation

## 2018-08-12 DIAGNOSIS — Z6841 Body Mass Index (BMI) 40.0 and over, adult: Secondary | ICD-10-CM

## 2018-08-12 MED ORDER — PHENTERMINE HCL 37.5 MG PO TABS: 37.5000 mg | ORAL_TABLET | Freq: Every day | ORAL | 2 refills | Status: DC

## 2018-08-12 MED ORDER — CYANOCOBALAMIN 1000 MCG/ML IJ SOLN: 1000.0000 ug | INTRAMUSCULAR | 1 refills | Status: DC

## 2018-08-12 MED ORDER — POLYETHYLENE GLYCOL 3350 17 GM/SCOOP PO POWD: 1.0000 | Freq: Once | ORAL | 6 refills | Status: AC

## 2018-08-12 NOTE — Progress Notes (Signed)
Subjective:   Danielle Stewart is a 37 y.o. G46P1011 Caucasian female here for a routine well-woman exam.  Patient's last menstrual period was 07/18/2018.    Current complaints: concerned about weight, lost 32#s last year and regained 17#s. The adipex made her very constipated. But desires to restart it with Miralax. Is walking daily.  Is still desiring a pregnancy, and menses have been a little off in timing.  PCP: baboff       doesn't desire labs  Social History: Sexual: heterosexual Marital Status: married Living situation: with spouse Occupation: [employer] Tobacco/alcohol: no tobacco use Illicit drugs: no history of illicit drug use  The following portions of the patient's history were reviewed and updated as appropriate: allergies, current medications, past family history, past medical history, past social history, past surgical history and problem list.  Past Medical History Past Medical History:  Diagnosis Date  . Depression   . GERD (gastroesophageal reflux disease)   . Left ovarian cyst   . Obesity     Past Surgical History History reviewed. No pertinent surgical history.  Gynecologic History G2P1011  Patient's last menstrual period was 07/18/2018. Contraception: none Last Pap: 2016. Results were: normal   Obstetric History OB History  Gravida Para Term Preterm AB Living  2 1 1   1 1   SAB TAB Ectopic Multiple Live Births  1       1    # Outcome Date GA Lbr Len/2nd Weight Sex Delivery Anes PTL Lv  2 Term 2009    F Vag-Spont   LIV  1 SAB             Current Medications Current Outpatient Medications on File Prior to Visit  Medication Sig Dispense Refill  . cyanocobalamin (,VITAMIN B-12,) 1000 MCG/ML injection Inject 1,000 mcg into the muscle once.    . hydrocortisone-pramoxine (ANALPRAM HC) 2.5-1 % rectal cream Place 1 application rectally 3 (three) times daily. (Patient not taking: Reported on 09/30/2017) 30 g 0  . phentermine 37.5 MG capsule Take 1 capsule  (37.5 mg total) by mouth every morning. (Patient not taking: Reported on 08/12/2018) 30 capsule 2   No current facility-administered medications on file prior to visit.     Review of Systems Patient denies any headaches, blurred vision, shortness of breath, chest pain, abdominal pain, problems with bowel movements, urination, or intercourse.  Objective:  BP 126/81   Pulse 76   Ht 5\' 9"  (1.753 m)   Wt 272 lb 3.2 oz (123.5 kg)   LMP 07/18/2018   BMI 40.20 kg/m   Physical Exam  General:  Well developed, well nourished, no acute distress. She is alert and oriented x3. Skin:  Warm and dry Neck:  Midline trachea, no thyromegaly or nodules Cardiovascular: Regular rate and rhythm, no murmur heard Lungs:  Effort normal, all lung fields clear to auscultation bilaterally Breasts:  No dominant palpable mass, retraction, or nipple discharge Abdomen:  Soft, non tender, no hepatosplenomegaly or masses Pelvic:  External genitalia is normal in appearance.  The vagina is normal in appearance. The cervix is bulbous, no CMT.  Thin prep pap is done with HR HPV cotesting. Uterus is felt to be normal size, shape, and contour.  No adnexal masses or tenderness noted. Extremities:  No swelling or varicosities noted Psych:  She has a normal mood and affect  Assessment:   Healthy well-woman exam Morbid obesity Needs flu vaccine  Plan:  Discussed restarting weight loss medication and joining weight watchers for maintenance, adpiex  and B12 restarted.  Flu vaccine given. F/U 1 year for AE, then 4 weeks for wt check.or sooner if needed Mammogram screening needed  Ross Hefferan Suzan Nailer, CNM

## 2018-08-12 NOTE — Patient Instructions (Signed)
Preventive Care 18-39 Years, Female Preventive care refers to lifestyle choices and visits with your health care provider that can promote health and wellness. What does preventive care include?  A yearly physical exam. This is also called an annual well check.  Dental exams once or twice a year.  Routine eye exams. Ask your health care provider how often you should have your eyes checked.  Personal lifestyle choices, including: ? Daily care of your teeth and gums. ? Regular physical activity. ? Eating a healthy diet. ? Avoiding tobacco and drug use. ? Limiting alcohol use. ? Practicing safe sex. ? Taking vitamin and mineral supplements as recommended by your health care provider. What happens during an annual well check? The services and screenings done by your health care provider during your annual well check will depend on your age, overall health, lifestyle risk factors, and family history of disease. Counseling Your health care provider may ask you questions about your:  Alcohol use.  Tobacco use.  Drug use.  Emotional well-being.  Home and relationship well-being.  Sexual activity.  Eating habits.  Work and work Statistician.  Method of birth control.  Menstrual cycle.  Pregnancy history.  Screening You may have the following tests or measurements:  Height, weight, and BMI.  Diabetes screening. This is done by checking your blood sugar (glucose) after you have not eaten for a while (fasting).  Blood pressure.  Lipid and cholesterol levels. These may be checked every 5 years starting at age 38.  Skin check.  Hepatitis C blood test.  Hepatitis B blood test.  Sexually transmitted disease (STD) testing.  BRCA-related cancer screening. This may be done if you have a family history of breast, ovarian, tubal, or peritoneal cancers.  Pelvic exam and Pap test. This may be done every 3 years starting at age 38. Starting at age 30, this may be done  every 5 years if you have a Pap test in combination with an HPV test.  Discuss your test results, treatment options, and if necessary, the need for more tests with your health care provider. Vaccines Your health care provider may recommend certain vaccines, such as:  Influenza vaccine. This is recommended every year.  Tetanus, diphtheria, and acellular pertussis (Tdap, Td) vaccine. You may need a Td booster every 10 years.  Varicella vaccine. You may need this if you have not been vaccinated.  HPV vaccine. If you are 39 or younger, you may need three doses over 6 months.  Measles, mumps, and rubella (MMR) vaccine. You may need at least one dose of MMR. You may also need a second dose.  Pneumococcal 13-valent conjugate (PCV13) vaccine. You may need this if you have certain conditions and were not previously vaccinated.  Pneumococcal polysaccharide (PPSV23) vaccine. You may need one or two doses if you smoke cigarettes or if you have certain conditions.  Meningococcal vaccine. One dose is recommended if you are age 68-21 years and a first-year college student living in a residence hall, or if you have one of several medical conditions. You may also need additional booster doses.  Hepatitis A vaccine. You may need this if you have certain conditions or if you travel or work in places where you may be exposed to hepatitis A.  Hepatitis B vaccine. You may need this if you have certain conditions or if you travel or work in places where you may be exposed to hepatitis B.  Haemophilus influenzae type b (Hib) vaccine. You may need this  if you have certain risk factors.  Talk to your health care provider about which screenings and vaccines you need and how often you need them. This information is not intended to replace advice given to you by your health care provider. Make sure you discuss any questions you have with your health care provider. Document Released: 12/25/2001 Document Revised:  07/18/2016 Document Reviewed: 08/30/2015 Elsevier Interactive Patient Education  2018 Elsevier Inc.  

## 2018-08-13 LAB — CYTOLOGY - PAP
Diagnosis: NEGATIVE
HPV (WINDOPATH): NOT DETECTED

## 2018-08-20 ENCOUNTER — Ambulatory Visit
Admission: RE | Admit: 2018-08-20 | Discharge: 2018-08-20 | Disposition: A | Discharge status: Home / Self Care | Payer: Managed Care, Other (non HMO) | Source: Ambulatory Visit | Attending: Obstetrics and Gynecology | Admitting: Obstetrics and Gynecology

## 2018-08-20 ENCOUNTER — Other Ambulatory Visit: Payer: Self-pay | Admitting: Obstetrics and Gynecology

## 2018-08-20 DIAGNOSIS — R928 Other abnormal and inconclusive findings on diagnostic imaging of breast: Secondary | ICD-10-CM

## 2018-08-20 DIAGNOSIS — Z01419 Encounter for gynecological examination (general) (routine) without abnormal findings: Secondary | ICD-10-CM | POA: Insufficient documentation

## 2018-08-20 DIAGNOSIS — N6310 Unspecified lump in the right breast, unspecified quadrant: Secondary | ICD-10-CM

## 2018-09-01 ENCOUNTER — Ambulatory Visit
Admission: RE | Admit: 2018-09-01 | Discharge: 2018-09-01 | Disposition: A | Discharge status: Home / Self Care | Payer: Managed Care, Other (non HMO) | Source: Ambulatory Visit | Attending: Obstetrics and Gynecology | Admitting: Obstetrics and Gynecology

## 2018-09-01 DIAGNOSIS — N6310 Unspecified lump in the right breast, unspecified quadrant: Secondary | ICD-10-CM

## 2018-09-01 DIAGNOSIS — R928 Other abnormal and inconclusive findings on diagnostic imaging of breast: Secondary | ICD-10-CM

## 2018-09-09 ENCOUNTER — Ambulatory Visit (INDEPENDENT_AMBULATORY_CARE_PROVIDER_SITE_OTHER): Payer: Managed Care, Other (non HMO) | Admitting: Obstetrics and Gynecology

## 2018-09-09 ENCOUNTER — Encounter: Payer: Self-pay | Admitting: Obstetrics and Gynecology

## 2018-09-09 NOTE — Progress Notes (Signed)
Pt is here for wt,bp check, b-12 inj She is doing well   09/09/18 wt- 268lb 11/12/17 wt- 272lb  Waist 43in

## 2018-10-08 ENCOUNTER — Encounter: Payer: Managed Care, Other (non HMO) | Admitting: Obstetrics and Gynecology

## 2018-10-17 ENCOUNTER — Encounter: Payer: Managed Care, Other (non HMO) | Admitting: Obstetrics and Gynecology

## 2018-10-27 ENCOUNTER — Other Ambulatory Visit: Payer: Self-pay | Admitting: *Deleted

## 2018-10-27 MED ORDER — LINACLOTIDE 145 MCG PO CAPS: 145.0000 ug | ORAL_CAPSULE | Freq: Every day | ORAL | 2 refills | Status: DC

## 2018-10-27 MED ORDER — PHENTERMINE HCL 37.5 MG PO CAPS: 37.5000 mg | ORAL_CAPSULE | ORAL | 2 refills | Status: DC

## 2019-07-16 ENCOUNTER — Other Ambulatory Visit: Payer: Self-pay | Admitting: Obstetrics and Gynecology

## 2019-07-16 DIAGNOSIS — R21 Rash and other nonspecific skin eruption: Secondary | ICD-10-CM

## 2019-07-17 ENCOUNTER — Other Ambulatory Visit: Payer: Managed Care, Other (non HMO)

## 2019-07-17 ENCOUNTER — Other Ambulatory Visit: Payer: Self-pay

## 2019-07-17 DIAGNOSIS — R21 Rash and other nonspecific skin eruption: Secondary | ICD-10-CM

## 2019-07-22 LAB — CBC WITH DIFFERENTIAL/PLATELET
Basophils Absolute: 0 10*3/uL (ref 0.0–0.2)
Basos: 0 %
EOS (ABSOLUTE): 0.1 10*3/uL (ref 0.0–0.4)
Eos: 1 %
Hematocrit: 38.3 % (ref 34.0–46.6)
Hemoglobin: 13.3 g/dL (ref 11.1–15.9)
Immature Grans (Abs): 0 10*3/uL (ref 0.0–0.1)
Immature Granulocytes: 0 %
Lymphocytes Absolute: 1.9 10*3/uL (ref 0.7–3.1)
Lymphs: 32 %
MCH: 30.7 pg (ref 26.6–33.0)
MCHC: 34.7 g/dL (ref 31.5–35.7)
MCV: 89 fL (ref 79–97)
Monocytes Absolute: 0.3 10*3/uL (ref 0.1–0.9)
Monocytes: 5 %
Neutrophils Absolute: 3.7 10*3/uL (ref 1.4–7.0)
Neutrophils: 62 %
Platelets: 189 10*3/uL (ref 150–450)
RBC: 4.33 x10E6/uL (ref 3.77–5.28)
RDW: 12.1 % (ref 11.7–15.4)
WBC: 6 10*3/uL (ref 3.4–10.8)

## 2019-07-22 LAB — HISTAMINE DETERMINATION, BLOOD: Histamine Determination, Blood: 33 ng/mL (ref 12–127)

## 2019-07-22 LAB — LYMEAB(IGG/M)+RMSF(IGG/M)
LYME DISEASE AB, QUANT, IGM: <0.8 index (ref 0.00–0.79)
Lyme IgG/IgM Ab: <0.91 {ISR} (ref 0.00–0.90)
RMSF IgG: NEGATIVE
RMSF IgM: 0.5 index (ref 0.00–0.89)

## 2019-07-22 LAB — FOOD ALLERGY PROFILE
Allergen Corn, IgE: <0.1 kU/L
Clam IgE: <0.1 kU/L
Codfish IgE: <0.1 kU/L
Egg White IgE: <0.1 kU/L
Milk IgE: <0.1 kU/L
Peanut IgE: <0.1 kU/L
Scallop IgE: <0.1 kU/L
Sesame Seed IgE: <0.1 kU/L
Shrimp IgE: <0.1 kU/L
Soybean IgE: <0.1 kU/L
Walnut IgE: <0.1 kU/L
Wheat IgE: <0.1 kU/L

## 2019-07-28 ENCOUNTER — Other Ambulatory Visit: Payer: Self-pay | Admitting: Obstetrics and Gynecology

## 2019-07-28 ENCOUNTER — Other Ambulatory Visit: Payer: Self-pay | Admitting: *Deleted

## 2019-07-28 DIAGNOSIS — R21 Rash and other nonspecific skin eruption: Secondary | ICD-10-CM

## 2020-07-15 ENCOUNTER — Ambulatory Visit (INDEPENDENT_AMBULATORY_CARE_PROVIDER_SITE_OTHER): Payer: Managed Care, Other (non HMO) | Admitting: Certified Nurse Midwife

## 2020-07-15 ENCOUNTER — Other Ambulatory Visit: Payer: Self-pay

## 2020-07-15 ENCOUNTER — Encounter: Payer: Self-pay | Admitting: Certified Nurse Midwife

## 2020-07-15 VITALS — BP 120/80 | HR 77 | Ht 69.0 in | Wt 278.8 lb

## 2020-07-15 DIAGNOSIS — Z1231 Encounter for screening mammogram for malignant neoplasm of breast: Secondary | ICD-10-CM

## 2020-07-15 DIAGNOSIS — Z01419 Encounter for gynecological examination (general) (routine) without abnormal findings: Secondary | ICD-10-CM | POA: Diagnosis not present

## 2020-07-15 DIAGNOSIS — N920 Excessive and frequent menstruation with regular cycle: Secondary | ICD-10-CM

## 2020-07-15 NOTE — Patient Instructions (Signed)
Preventive Care 40-40 Years Old, Female °Preventive care refers to visits with your health care provider and lifestyle choices that can promote health and wellness. This includes: °· A yearly physical exam. This may also be called an annual well check. °· Regular dental visits and eye exams. °· Immunizations. °· Screening for certain conditions. °· Healthy lifestyle choices, such as eating a healthy diet, getting regular exercise, not using drugs or products that contain nicotine and tobacco, and limiting alcohol use. °What can I expect for my preventive care visit? °Physical exam °Your health care provider will check your: °· Height and weight. This may be used to calculate body mass index (BMI), which tells if you are at a healthy weight. °· Heart rate and blood pressure. °· Skin for abnormal spots. °Counseling °Your health care provider may ask you questions about your: °· Alcohol, tobacco, and drug use. °· Emotional well-being. °· Home and relationship well-being. °· Sexual activity. °· Eating habits. °· Work and work environment. °· Method of birth control. °· Menstrual cycle. °· Pregnancy history. °What immunizations do I need? ° °Influenza (flu) vaccine °· This is recommended every year. °Tetanus, diphtheria, and pertussis (Tdap) vaccine °· You may need a Td booster every 10 years. °Varicella (chickenpox) vaccine °· You may need this if you have not been vaccinated. °Zoster (shingles) vaccine °· You may need this after age 60. °Measles, mumps, and rubella (MMR) vaccine °· You may need at least one dose of MMR if you were born in 1957 or later. You may also need a second dose. °Pneumococcal conjugate (PCV13) vaccine °· You may need this if you have certain conditions and were not previously vaccinated. °Pneumococcal polysaccharide (PPSV23) vaccine °· You may need one or two doses if you smoke cigarettes or if you have certain conditions. °Meningococcal conjugate (MenACWY) vaccine °· You may need this if you  have certain conditions. °Hepatitis A vaccine °· You may need this if you have certain conditions or if you travel or work in places where you may be exposed to hepatitis A. °Hepatitis B vaccine °· You may need this if you have certain conditions or if you travel or work in places where you may be exposed to hepatitis B. °Haemophilus influenzae type b (Hib) vaccine °· You may need this if you have certain conditions. °Human papillomavirus (HPV) vaccine °· If recommended by your health care provider, you may need three doses over 6 months. °You may receive vaccines as individual doses or as more than one vaccine together in one shot (combination vaccines). Talk with your health care provider about the risks and benefits of combination vaccines. °What tests do I need? °Blood tests °· Lipid and cholesterol levels. These may be checked every 5 years, or more frequently if you are over 50 years old. °· Hepatitis C test. °· Hepatitis B test. °Screening °· Lung cancer screening. You may have this screening every year starting at age 55 if you have a 30-pack-year history of smoking and currently smoke or have quit within the past 15 years. °· Colorectal cancer screening. All adults should have this screening starting at age 50 and continuing until age 75. Your health care provider may recommend screening at age 45 if you are at increased risk. You will have tests every 1-10 years, depending on your results and the type of screening test. °· Diabetes screening. This is done by checking your blood sugar (glucose) after you have not eaten for a while (fasting). You may have this   done every 1-3 years.  Mammogram. This may be done every 1-2 years. Talk with your health care provider about when you should start having regular mammograms. This may depend on whether you have a family history of breast cancer.  BRCA-related cancer screening. This may be done if you have a family history of breast, ovarian, tubal, or peritoneal  cancers.  Pelvic exam and Pap test. This may be done every 3 years starting at age 82. Starting at age 61, this may be done every 5 years if you have a Pap test in combination with an HPV test. Other tests  Sexually transmitted disease (STD) testing.  Bone density scan. This is done to screen for osteoporosis. You may have this scan if you are at high risk for osteoporosis. Follow these instructions at home: Eating and drinking  Eat a diet that includes fresh fruits and vegetables, whole grains, lean protein, and low-fat dairy.  Take vitamin and mineral supplements as recommended by your health care provider.  Do not drink alcohol if: ? Your health care provider tells you not to drink. ? You are pregnant, may be pregnant, or are planning to become pregnant.  If you drink alcohol: ? Limit how much you have to 0-1 drink a day. ? Be aware of how much alcohol is in your drink. In the [place] one drink equals one 12 oz bottle of beer (355 mL), one 5 oz glass of wine (148 mL), or one 1 oz glass of hard liquor (44 mL). Lifestyle  Take daily care of your teeth and gums.  Stay active. Exercise for at least 30 minutes on 5 or more days each week.  Do not use any products that contain nicotine or tobacco, such as cigarettes, e-cigarettes, and chewing tobacco. If you need help quitting, ask your health care provider.  If you are sexually active, practice safe sex. Use a condom or other form of birth control (contraception) in order to prevent pregnancy and STIs (sexually transmitted infections).  If told by your health care provider, take low-dose aspirin daily starting at age 3. What's next?  Visit your health care provider once a year for a well check visit.  Ask your health care provider how often you should have your eyes and teeth checked.  Stay up to date on all vaccines. This information is not intended to replace advice given to you by your health care provider. Make sure you  discuss any questions you have with your health care provider. Document Revised: 07/10/2018 Document Reviewed: 07/10/2018 Elsevier Patient Education  [employer].    Menorrhagia Menorrhagia is when your menstrual periods are heavy or last longer than normal. Follow these instructions at home: Medicines   Take over-the-counter and prescription medicines exactly as told by your doctor. This includes iron pills.  Do not change or switch medicines without asking your doctor.  Do not take aspirin or medicines that contain aspirin 1 week before or during your period. Aspirin may make bleeding worse. General instructions  If you need to change your pad or tampon more than once every 2 hours, limit your activity until the bleeding stops.  Iron pills can cause problems when pooping (constipation). To prevent or treat pooping problems while taking prescription iron pills, your doctor may suggest that you: ? Drink enough fluid to keep your pee (urine) clear or pale yellow. ? Take over-the-counter or prescription medicines. ? Eat foods that are high in fiber. These foods include:  Fresh fruits and  vegetables.  Whole grains.  Beans. ? Limit foods that are high in fat and processed sugars. This includes fried and sweet foods.  Eat healthy meals and foods that are high in iron. Foods that have a lot of iron include: ? Leafy green vegetables. ? Meat. ? Liver. ? Eggs. ? Whole grain breads and cereals.  Do not try to lose weight until your heavy bleeding has stopped and you have normal amounts of iron in your blood. If you need to lose weight, work with your doctor.  Keep all follow-up visits as told by your doctor. This is important. Contact a doctor if:  You soak through a pad or tampon every 1 or 2 hours, and this happens every time you have a period.  You need to use pads and tampons at the same time because you are bleeding so much.  You are taking medicine and you: ? Feel  sick to your stomach (nauseous). ? Throw up (vomit). ? Have watery poop (diarrhea).  You have other problems that may be related to the medicine you are taking. Get help right away if:  You soak through more than a pad or tampon in 1 hour.  You pass clots bigger than 1 inch (2.5 cm) wide.  You feel short of breath.  You feel like your heart is beating too fast.  You feel dizzy or you pass out (faint).  You feel very weak or tired. Summary  Menorrhagia is when your menstrual periods are heavy or last longer than normal.  Take over-the-counter and prescription medicines exactly as told by your doctor. This includes iron pills.  Contact a doctor if you soak through more than a pad or tampon in 1 hour or are passing large clots. This information is not intended to replace advice given to you by your health care provider. Make sure you discuss any questions you have with your health care provider. Document Revised: 02/05/2018 Document Reviewed: 11/19/2016 Elsevier Patient Education  [place].

## 2020-07-15 NOTE — Progress Notes (Signed)
ANNUAL PREVENTATIVE CARE GYN  ENCOUNTER NOTE  Subjective:       Danielle Stewart is a 39 y.o. G60P1011 female here for a routine annual gynecologic exam.  Current complaints: 1. Requests labs 2. Needs screening mammogram  Denies difficulty breathing or respiratory distress, chest pain, abdominal pain, dysuria, and leg pain or swelling.    Gynecologic History  Patient's last menstrual period was 06/29/2020 (exact date).  Contraception: none  Last Pap: 08/2018. Results were: Neg/Neg  Last mammogram: 08/2018. Results were: BI-RADS 2  Obstetric History  OB History  Gravida Para Term Preterm AB Living  2 1 1   1 1   SAB TAB Ectopic Multiple Live Births  1       1    # Outcome Date GA Lbr Len/2nd Weight Sex Delivery Anes PTL Lv  2 Term 2009    F Vag-Spont   LIV  1 SAB             Past Medical History:  Diagnosis Date  . Depression   . GERD (gastroesophageal reflux disease)   . Left ovarian cyst   . Obesity     History reviewed. No pertinent surgical history.  Current Outpatient Medications on File Prior to Visit  Medication Sig Dispense Refill  . cyanocobalamin (,VITAMIN B-12,) 1000 MCG/ML injection Inject 1,000 mcg into the muscle once.    . cyanocobalamin (,VITAMIN B-12,) 1000 MCG/ML injection Inject 1 mL (1,000 mcg total) into the muscle every 30 (thirty) days. (Patient not taking: Reported on 09/09/2018) 10 mL 1  . linaclotide (LINZESS) 145 MCG CAPS capsule Take 1 capsule (145 mcg total) by mouth daily before breakfast. 30 capsule 2  . phentermine (ADIPEX-P) 37.5 MG tablet Take 1 tablet (37.5 mg total) by mouth daily before breakfast. 30 tablet 2  . phentermine 37.5 MG capsule Take 1 capsule (37.5 mg total) by mouth every morning. 30 capsule 2   No current facility-administered medications on file prior to visit.    No Known Allergies  Social History   Socioeconomic History  . Marital status: Married    Spouse name: Not on file  . Number of children: Not on file   . Years of education: Not on file  . Highest education level: Not on file  Occupational History  . Not on file  Tobacco Use  . Smoking status: Never Smoker  . Smokeless tobacco: Never Used  Vaping Use  . Vaping Use: Never used  Substance and Sexual Activity  . Alcohol use: No  . Drug use: No  . Sexual activity: Yes    Birth control/protection: None  Other Topics Concern  . Not on file  Social History Narrative  . Not on file   Social Determinants of Health   Financial Resource Strain:   . Difficulty of Paying Living Expenses: Not on file  Food Insecurity:   . Worried About 09/11/2018 in the Last Year: Not on file  . Ran Out of Food in the Last Year: Not on file  Transportation Needs:   . Lack of Transportation (Medical): Not on file  . Lack of Transportation (Non-Medical): Not on file  Physical Activity:   . Days of Exercise per Week: Not on file  . Minutes of Exercise per Session: Not on file  Stress:   . Feeling of Stress : Not on file  Social Connections:   . Frequency of Communication with Friends and Family: Not on file  . Frequency of Social Gatherings with  Friends and Family: Not on file  . Attends Religious Services: Not on file  . Active Member of Clubs or Organizations: Not on file  . Attends Banker Meetings: Not on file  . Marital Status: Not on file  Intimate Partner Violence:   . Fear of Current or Ex-Partner: Not on file  . Emotionally Abused: Not on file  . Physically Abused: Not on file  . Sexually Abused: Not on file    Family History  Problem Relation Age of Onset  . Diabetes Mother   . Cancer Maternal Grandmother        ovarian  . Breast cancer Neg Hx     The following portions of the patient's history were reviewed and updated as appropriate: allergies, current medications, past family history, past medical history, past social history, past surgical history and problem list.  Review of Systems  ROS negative  except as noted above. Information obtained from patient.    Objective:   BP 120/80   Pulse 77   Ht 5\' 9"  (1.753 m)   Wt 278 lb 12.8 oz (126.5 kg)   LMP 06/29/2020 (Exact Date)   BMI 41.17 kg/m    CONSTITUTIONAL: Well-developed, well-nourished female in no acute distress.   PSYCHIATRIC: Normal mood and affect. Normal behavior. Normal judgment and thought content.  NEUROLGIC: Alert and oriented to person, place, and time. Normal muscle tone coordination. No cranial nerve deficit noted.  HENT:  Normocephalic, atraumatic, External right and left ear normal.   EYES: Conjunctivae and EOM are normal. Pupils are equal and round.   NECK: Normal range of motion, supple, no masses.  Normal thyroid.   SKIN: Skin is warm and dry. No rash noted. Not diaphoretic. No erythema. No pallor.  CARDIOVASCULAR: Normal heart rate noted, regular rhythm, no murmur.  RESPIRATORY: Clear to auscultation bilaterally. Effort and breath sounds normal, no problems with respiration noted.  BREASTS: Symmetric in size. No masses, skin changes, nipple drainage, or lymphadenopathy.  ABDOMEN: Soft, normal bowel sounds, no distention noted.  No tenderness, rebound or guarding.   PELVIC:  External Genitalia: Normal  Vagina: Normal  Cervix: Normal  Uterus: Normal  Adnexa: Normal   MUSCULOSKELETAL: Normal range of motion. No tenderness.  No cyanosis, clubbing, or edema.  2+ distal pulses.  LYMPHATIC: No Axillary, Supraclavicular, or Inguinal Adenopathy.  Assessment:   Annual gynecologic examination 39 y.o.   Contraception: none   Obesity 3   Problem List Items Addressed This Visit    None    Visit Diagnoses    Well woman exam    -  Primary   Relevant Orders   MM 3D SCREEN BREAST BILATERAL   Screening mammogram, encounter for       Relevant Orders   MM 3D SCREEN BREAST BILATERAL      Plan:   Pap: Not needed  Mammogram: Ordered  Labs: See orders  Routine preventative health maintenance  measures emphasized: Exercise/Diet/Weight control, Tobacco Warnings, Alcohol/Substance use risks and Stress Management; see AVS  Reviewed red flag symptoms and when to call  RTC for ultrasound and results discussion  Return to Clinic - 1 Year for 40 or sooner if needed   [employer], CNM  [facility], [facility] 07/15/20 8:36 AM

## 2020-07-16 LAB — LIPID PANEL
Chol/HDL Ratio: 3.9 ratio (ref 0.0–4.4)
Cholesterol, Total: 184 mg/dL (ref 100–199)
HDL: 47 mg/dL (ref >39)
LDL Chol Calc (NIH): 117 mg/dL — ABNORMAL HIGH (ref 0–99)
Triglycerides: 108 mg/dL (ref 0–149)
VLDL Cholesterol Cal: 20 mg/dL (ref 5–40)

## 2020-07-16 LAB — COMPREHENSIVE METABOLIC PANEL
ALT: 11 IU/L (ref 0–32)
AST: 15 IU/L (ref 0–40)
Albumin/Globulin Ratio: 1.9 (ref 1.2–2.2)
Albumin: 4.3 g/dL (ref 3.8–4.8)
Alkaline Phosphatase: 100 IU/L (ref 48–121)
BUN/Creatinine Ratio: 16 (ref 9–23)
BUN: 12 mg/dL (ref 6–24)
Bilirubin Total: 0.6 mg/dL (ref 0.0–1.2)
CO2: 23 mmol/L (ref 20–29)
Calcium: 8.9 mg/dL (ref 8.7–10.2)
Chloride: 102 mmol/L (ref 96–106)
Creatinine, Ser: 0.76 mg/dL (ref 0.57–1.00)
GFR calc Af Amer: 113 mL/min/{1.73_m2} (ref >59)
GFR calc non Af Amer: 98 mL/min/{1.73_m2} (ref >59)
Globulin, Total: 2.3 g/dL (ref 1.5–4.5)
Glucose: 88 mg/dL (ref 65–99)
Potassium: 4.3 mmol/L (ref 3.5–5.2)
Sodium: 139 mmol/L (ref 134–144)
Total Protein: 6.6 g/dL (ref 6.0–8.5)

## 2020-07-16 LAB — CBC
Hematocrit: 38.6 % (ref 34.0–46.6)
Hemoglobin: 12.7 g/dL (ref 11.1–15.9)
MCH: 29.1 pg (ref 26.6–33.0)
MCHC: 32.9 g/dL (ref 31.5–35.7)
MCV: 89 fL (ref 79–97)
Platelets: 189 10*3/uL (ref 150–450)
RBC: 4.36 x10E6/uL (ref 3.77–5.28)
RDW: 12.9 % (ref 11.7–15.4)
WBC: 6 10*3/uL (ref 3.4–10.8)

## 2020-07-16 LAB — ESTRADIOL: Estradiol: 408 pg/mL

## 2020-07-16 LAB — THYROID PANEL WITH TSH
Free Thyroxine Index: 2.1 (ref 1.2–4.9)
T3 Uptake Ratio: 27 % (ref 24–39)
T4, Total: 7.9 ug/dL (ref 4.5–12.0)
TSH: 0.925 u[IU]/mL (ref 0.450–4.500)

## 2020-07-16 LAB — HEMOGLOBIN A1C
Est. average glucose Bld gHb Est-mCnc: 103 mg/dL
Hgb A1c MFr Bld: 5.2 % (ref 4.8–5.6)

## 2020-07-16 LAB — VITAMIN D 25 HYDROXY (VIT D DEFICIENCY, FRACTURES): Vit D, 25-Hydroxy: 22.9 ng/mL — ABNORMAL LOW (ref 30.0–100.0)

## 2020-07-16 LAB — FSH/LH
FSH: 9.5 m[IU]/mL
LH: 44.1 m[IU]/mL

## 2020-07-28 ENCOUNTER — Other Ambulatory Visit: Payer: Managed Care, Other (non HMO)

## 2020-07-30 IMAGING — US US BREAST*R* LIMITED INC AXILLA
1 series · 6 of 6 positions shown · non-contrast
Comparison: Previous exam(s).

CLINICAL DATA: Right breast upper outer quadrant possible mass seen
on most recent screening mammography.

EXAM:
DIGITAL DIAGNOSTIC RIGHT MAMMOGRAM WITH CAD AND TOMO
ULTRASOUND RIGHT BREAST

[Series 1: us breast*right* limited inc axilla · 0.05mm/px · 6 of 6 slices shown]
[im 1/6]
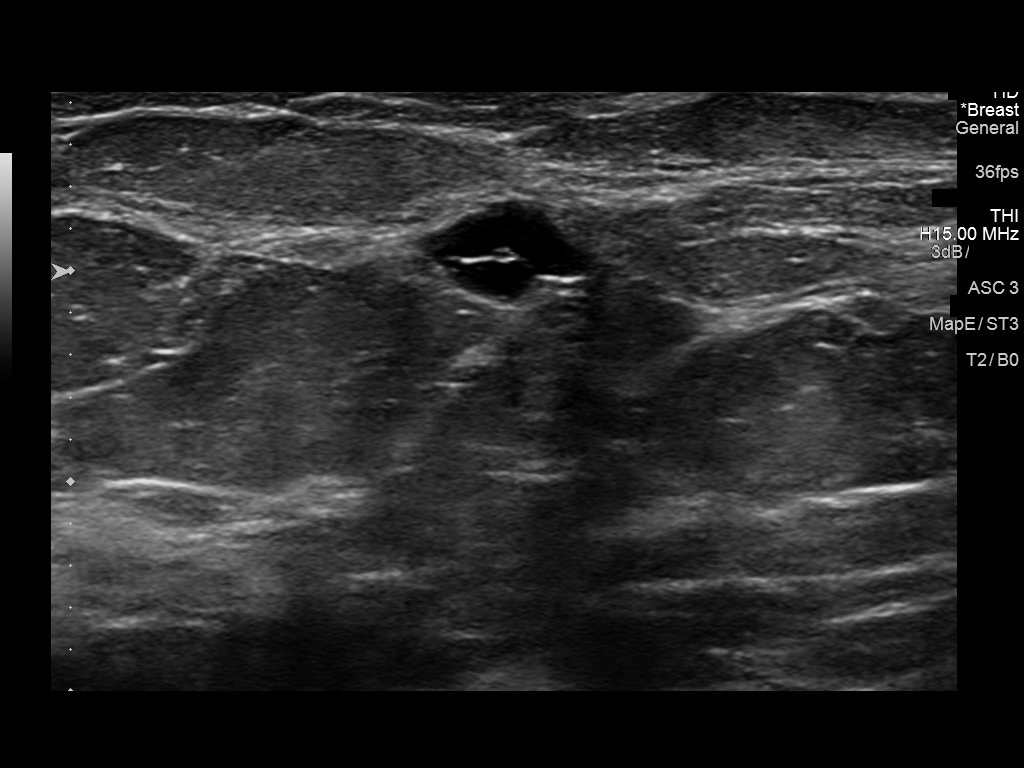
[im 2/6]
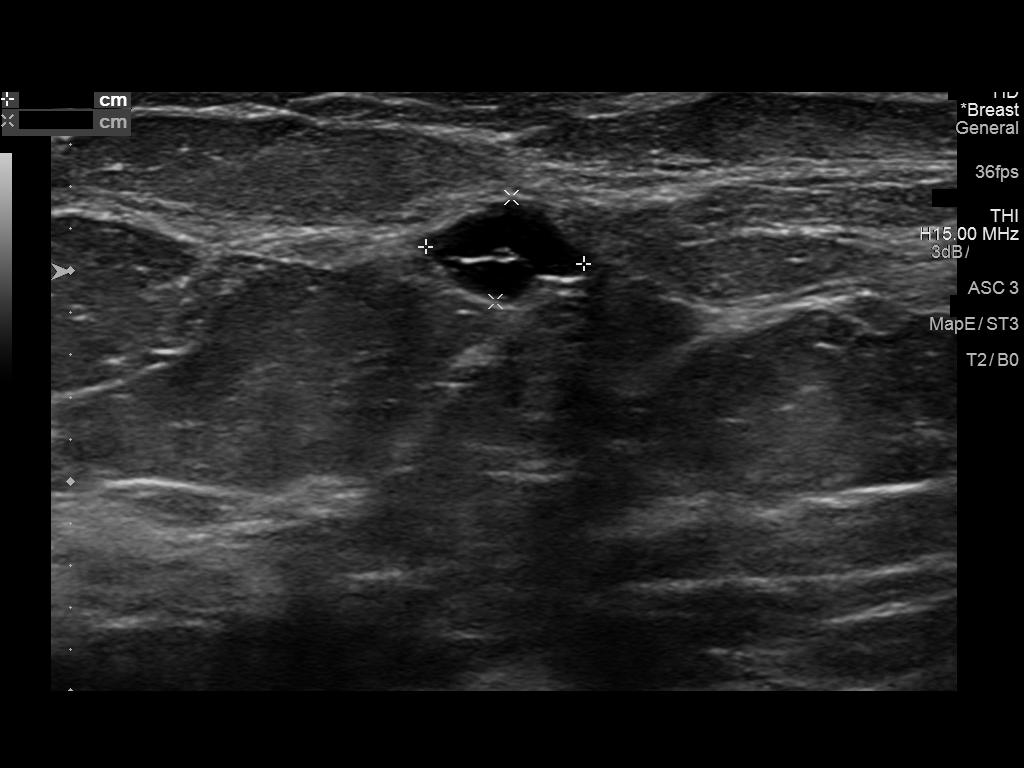
[im 3/6]
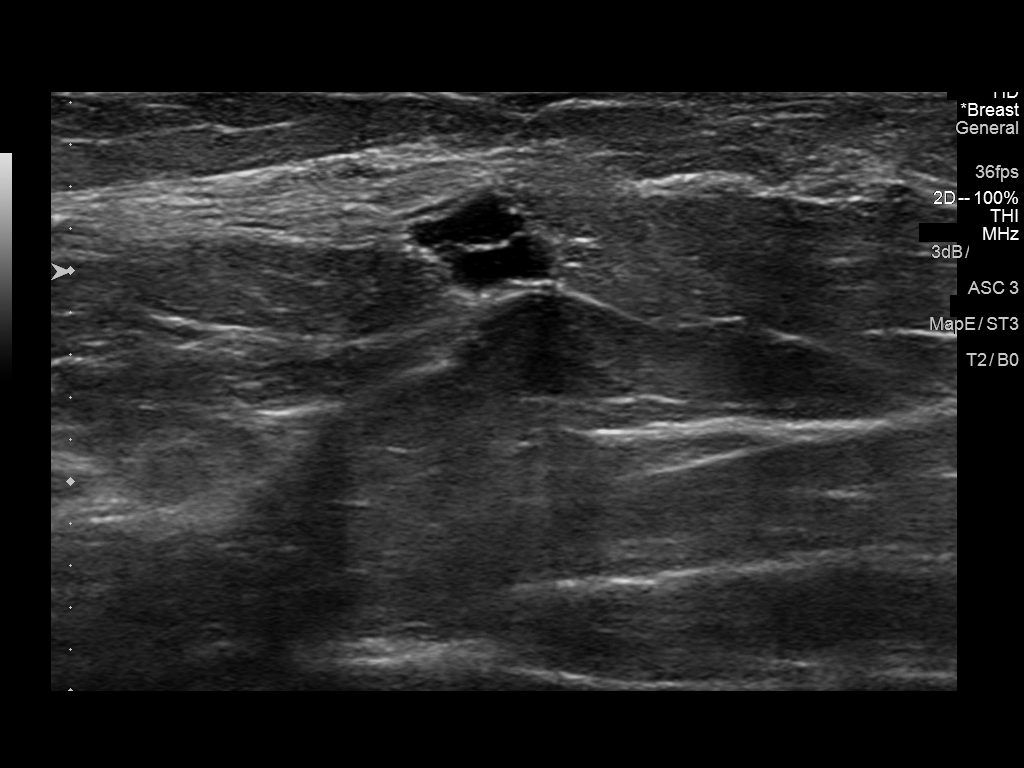
[im 4/6]
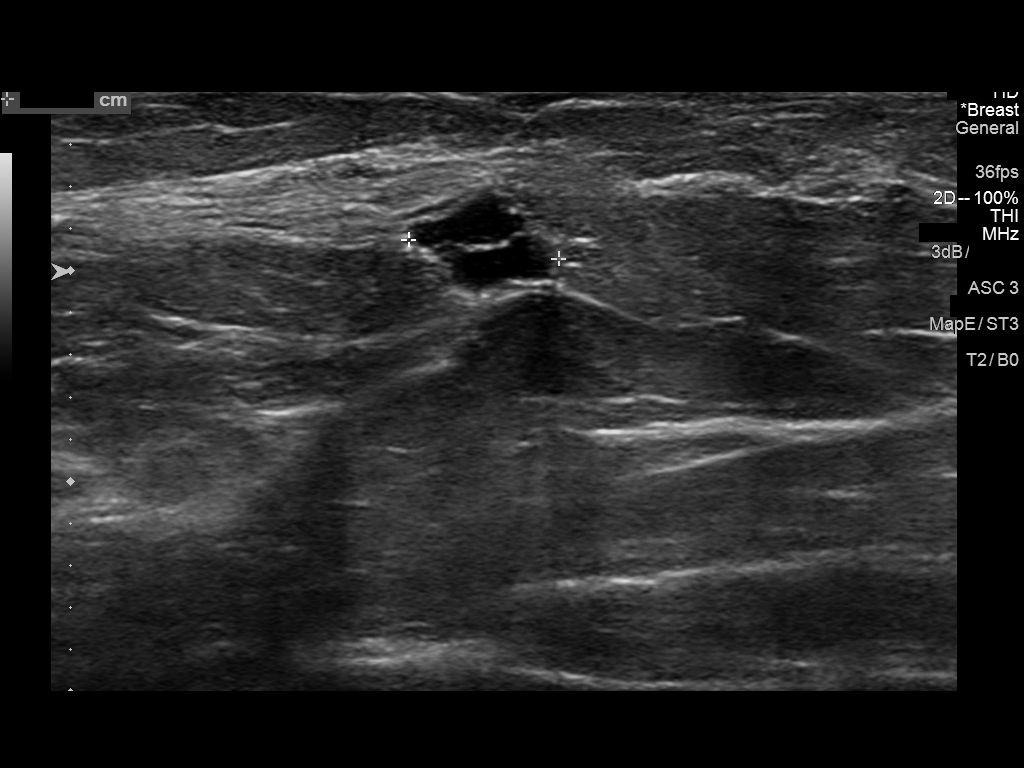
[im 5/6]
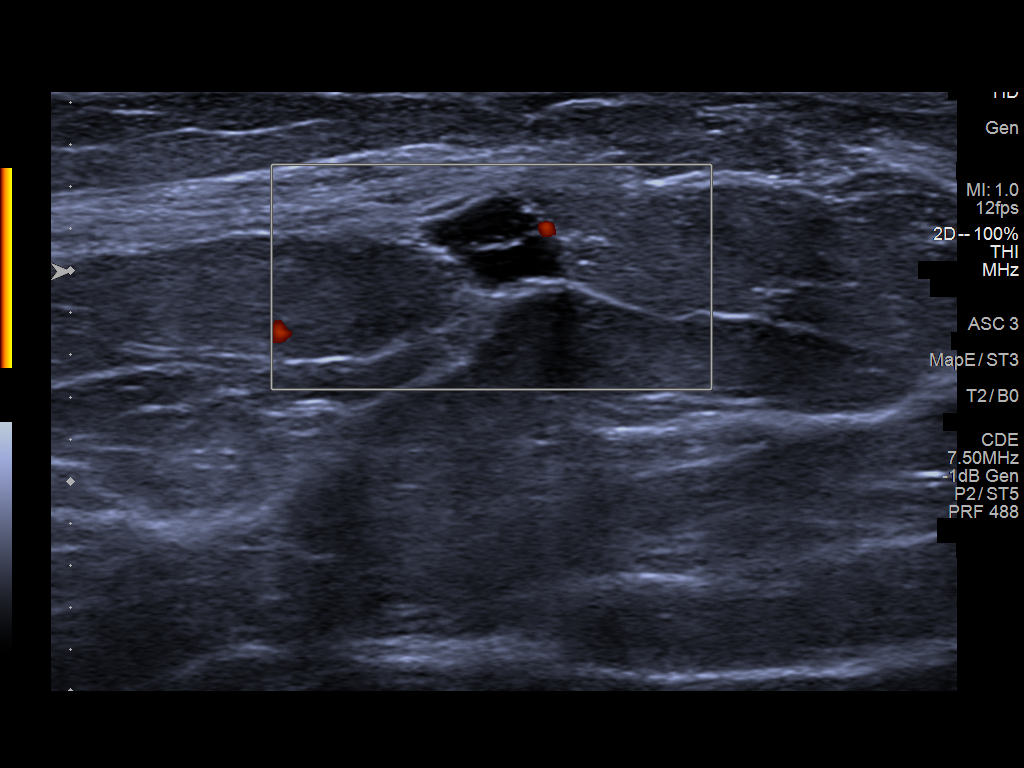
[im 6/6]
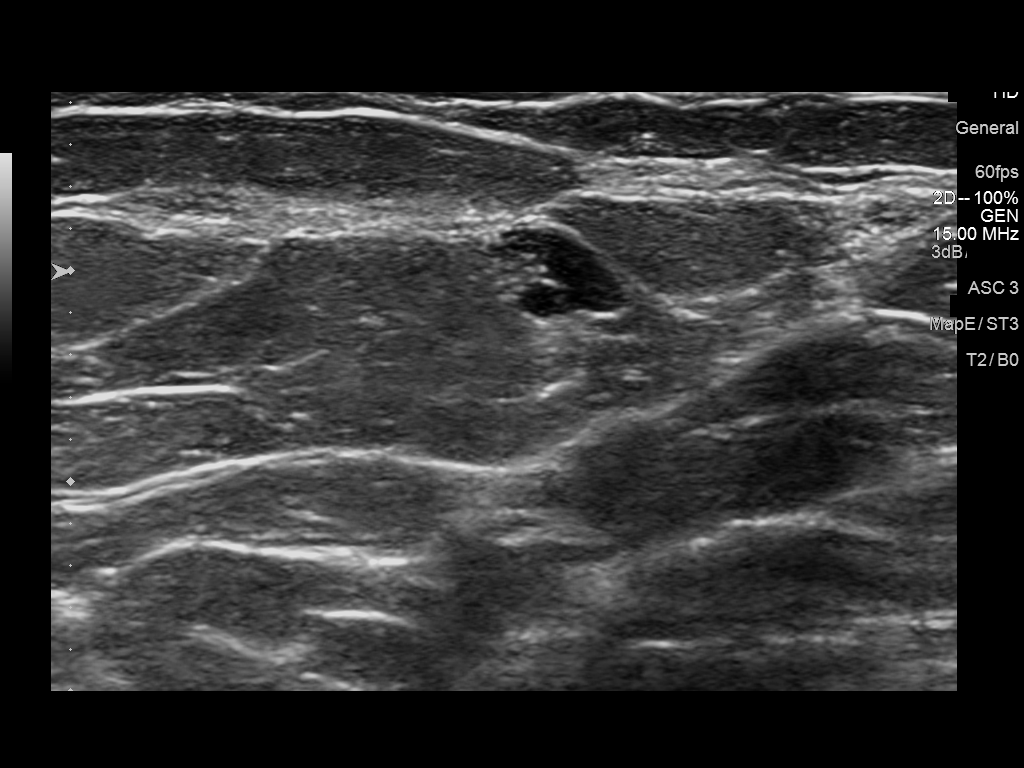

[6 of 6 positions shown; findings below may reference images not displayed]

ACR Breast Density Category b: There are scattered areas of
fibroglandular density.
FINDINGS: Additional mammographic views of the right breast demonstrate near
complete effacement of the previously seen possible subcentimeter
nodule in the right breast upper outer quadrant, suggestive of
benign nature of the abnormality.

Mammographic images were processed with CAD.

On physical exam, no suspicious masses are palpated.

Targeted ultrasound is performed, showing right breast 10 o'clock 5
cm from the nipple benign septated cyst measuring 0.8 x 0.5 x
cm. This finding corresponds to the mammographically seen nodule.
IMPRESSION: No mammographic or sonographic evidence of malignancy in the right
breast.

Benign septated cyst in the right 10 o'clock breast.

RECOMMENDATION:
Screening mammogram in one year.(Code:[id number]-L-6WU)

I have discussed the findings and recommendations with the patient.
Results were also provided in writing at the conclusion of the
visit. If applicable, a reminder letter will be sent to the patient
regarding the next appointment.

BI-RADS CATEGORY  2: Benign.

## 2020-08-18 ENCOUNTER — Ambulatory Visit (INDEPENDENT_AMBULATORY_CARE_PROVIDER_SITE_OTHER): Payer: Managed Care, Other (non HMO)

## 2020-08-18 ENCOUNTER — Encounter: Payer: Self-pay | Admitting: Certified Nurse Midwife

## 2020-08-18 ENCOUNTER — Ambulatory Visit: Payer: Managed Care, Other (non HMO) | Admitting: Certified Nurse Midwife

## 2020-08-18 ENCOUNTER — Other Ambulatory Visit: Payer: Self-pay

## 2020-08-18 VITALS — BP 118/82 | Ht 69.0 in | Wt 286.0 lb

## 2020-08-18 DIAGNOSIS — N83201 Unspecified ovarian cyst, right side: Secondary | ICD-10-CM | POA: Diagnosis not present

## 2020-08-18 DIAGNOSIS — Z01419 Encounter for gynecological examination (general) (routine) without abnormal findings: Secondary | ICD-10-CM | POA: Diagnosis not present

## 2020-08-18 DIAGNOSIS — N920 Excessive and frequent menstruation with regular cycle: Secondary | ICD-10-CM

## 2020-08-18 NOTE — Patient Instructions (Signed)
Ovarian Cyst An ovarian cyst is a fluid-filled sac on an ovary. The ovaries are organs that make eggs in women. Most ovarian cysts go away on their own and are not cancerous (are benign). Some cysts need treatment. Follow these instructions at home:  Take over-the-counter and prescription medicines only as told by your doctor.  Do not drive or use heavy machinery while taking prescription pain medicine.  Get pelvic exams and Pap tests as often as told by your doctor.  Return to your normal activities as told by your doctor. Ask your doctor what activities are safe for you.  Do not use any products that contain nicotine or tobacco, such as cigarettes and e-cigarettes. If you need help quitting, ask your doctor.  Keep all follow-up visits as told by your doctor. This is important. Contact a doctor if:  Your periods are: ? Late. ? Irregular. ? Painful.   Your periods stop.  You have pelvic pain that does not go away.  You have pressure on your bladder.  You have trouble making your bladder empty when you pee (urinate).  You have pain during sex.  You have any of the following in your belly (abdomen): ? A feeling of fullness. ? Pressure. ? Discomfort. ? Pain that does not go away. ? Swelling.  You feel sick most of the time.  You have trouble pooping (have constipation).  You are not as hungry as usual (you lose your appetite).  You get very bad acne.  You start to have more hair on your body and face.  You are gaining weight or losing weight without changing your exercise and eating habits.  You think you may be pregnant. Get help right away if:  You have belly pain that is very bad or gets worse.  You cannot eat or drink without throwing up (vomiting).  You suddenly get a fever.  Your period is a lot heavier than usual. This information is not intended to replace advice given to you by your health care provider. Make sure you discuss any questions you have  with your health care provider. Document Revised: 10/11/2017 Document Reviewed: 04/01/2016 Elsevier Patient Education  2020 Elsevier Inc.  

## 2020-08-18 NOTE — Progress Notes (Signed)
GYN ENCOUNTER NOTE  Subjective:       Danielle Stewart is a 39 y.o. G2P1011 female here for results review and plan of care discussion.   Last seen for ANNUAL EXAM on 07/15/2020; for further details, please see previous note.   Denies difficulty breathing or respiratory distress, chest pain, abdominal pain, dysuria, and leg pain or swelling.    Gynecologic History  Patient's last menstrual period was 07/31/2020.  Contraception: none  Last Pap: 08/2018. Results were: Neg/Neg  Last mammogram: 08/2018. Results were: BI-RADS 2   Obstetric History  OB History  Gravida Para Term Preterm AB Living  2 1 1   1 1  SAB TAB Ectopic Multiple Live Births  1       1    # Outcome Date GA Lbr Len/2nd Weight Sex Delivery Anes PTL Lv  2 Term 2009    F Vag-Spont   LIV  1 SAB             Past Medical History:  Diagnosis Date  . Depression   . GERD (gastroesophageal reflux disease)   . Left ovarian cyst   . Obesity     No past surgical history on file.  No current outpatient medications on file prior to visit.   No current facility-administered medications on file prior to visit.    No Known Allergies  Social History   Socioeconomic History  . Marital status: Married    Spouse name: Not on file  . Number of children: Not on file  . Years of education: Not on file  . Highest education level: Not on file  Occupational History  . Not on file  Tobacco Use  . Smoking status: Never Smoker  . Smokeless tobacco: Never Used  Vaping Use  . Vaping Use: Never used  Substance and Sexual Activity  . Alcohol use: No  . Drug use: No  . Sexual activity: Yes    Birth control/protection: None  Other Topics Concern  . Not on file  Social History Narrative  . Not on file   Social Determinants of Health   Financial Resource Strain:   . Difficulty of Paying Living Expenses: Not on file  Food Insecurity:   . Worried About Running Out of Food in the Last Year: Not on file  . Ran Out  of Food in the Last Year: Not on file  Transportation Needs:   . Lack of Transportation (Medical): Not on file  . Lack of Transportation (Non-Medical): Not on file  Physical Activity:   . Days of Exercise per Week: Not on file  . Minutes of Exercise per Session: Not on file  Stress:   . Feeling of Stress : Not on file  Social Connections:   . Frequency of Communication with Friends and Family: Not on file  . Frequency of Social Gatherings with Friends and Family: Not on file  . Attends Religious Services: Not on file  . Active Member of Clubs or Organizations: Not on file  . Attends Club or Organization Meetings: Not on file  . Marital Status: Not on file  Intimate Partner Violence:   . Fear of Current or Ex-Partner: Not on file  . Emotionally Abused: Not on file  . Physically Abused: Not on file  . Sexually Abused: Not on file    Family History  Problem Relation Age of Onset  . Diabetes Mother   . Cancer Maternal Grandmother        ovarian  .   Breast cancer Neg Hx     The following portions of the patient's history were reviewed and updated as appropriate: allergies, current medications, past family history, past medical history, past social history, past surgical history and problem list.  Review of Systems  ROS negative except as noted above. Information obtained from patient.   Objective:   BP 118/82   Ht 5' 9" (1.753 m)   Wt 286 lb (129.7 kg)   LMP 07/31/2020   BMI 42.23 kg/m   CONSTITUTIONAL: Well-developed, well-nourished female in no acute distress.   PHYSICAL EXAM: Not indicated.   Recent Results (from the past 2160 hour(s))  CBC     Status: None   Collection Time: 07/15/20  9:08 AM  Result Value Ref Range   WBC 6.0 3.4 - 10.8 x10E3/uL   RBC 4.36 3.77 - 5.28 x10E6/uL   Hemoglobin 12.7 11.1 - 15.9 g/dL   Hematocrit 38.6 34.0 - 46.6 %   MCV 89 79 - 97 fL   MCH 29.1 26.6 - 33.0 pg   MCHC 32.9 31 - 35 g/dL   RDW 12.9 11.7 - 15.4 %   Platelets 189 150  - 450 x10E3/uL  Thyroid Panel With TSH     Status: None   Collection Time: 07/15/20  9:08 AM  Result Value Ref Range   TSH 0.925 0.450 - 4.500 uIU/mL   T4, Total 7.9 4.5 - 12.0 ug/dL   T3 Uptake Ratio 27 24 - 39 %   Free Thyroxine Index 2.1 1.2 - 4.9  Comprehensive metabolic panel     Status: None   Collection Time: 07/15/20  9:08 AM  Result Value Ref Range   Glucose 88 65 - 99 mg/dL   BUN 12 6 - 24 mg/dL   Creatinine, Ser 0.76 0.57 - 1.00 mg/dL   GFR calc non Af Amer 98 >59 mL/min/1.73   GFR calc Af Amer 113 >59 mL/min/1.73    Comment: **[facility] currently reports eGFR in compliance with the current**   recommendations of the National Kidney Foundation. [facility] will   update reporting as new guidelines are published from the NKF-ASN   Task force.    BUN/Creatinine Ratio 16 9 - 23   Sodium 139 134 - 144 mmol/L   Potassium 4.3 3.5 - 5.2 mmol/L   Chloride 102 96 - 106 mmol/L   CO2 23 20 - 29 mmol/L   Calcium 8.9 8.7 - 10.2 mg/dL   Total Protein 6.6 6.0 - 8.5 g/dL   Albumin 4.3 3.8 - 4.8 g/dL   Globulin, Total 2.3 1.5 - 4.5 g/dL   Albumin/Globulin Ratio 1.9 1.2 - 2.2   Bilirubin Total 0.6 0.0 - 1.2 mg/dL   Alkaline Phosphatase 100 48 - 121 IU/L    Comment: **Effective July 25, 2020 Alkaline Phosphatase**   reference interval will be changing to:              Age                Female          Female           0 -  5 days         47 - 127       47 - 127           6 - 10 days         29 - 242       29 - 242            11 - 20 days        109 - 357      109 - 357          21 - 30 days         94 - 494       94 - 494           1 -  2 months      149 - 539      149 - 539           3 -  6 months      131 - 452      131 - 452           7 - 11 months      117 - 401      117 - 401   12 months -  6 years       158 - 369      158 - 369           7 - 12 years       150 - 409      150 - 409               13 years       156 - 435       78 - 227               14 years       114 - 375        64 - 161               15 years        88 - 279       56 - 134               16 years        74 - 207       51 - 121               17 years        63 - 161       47 - 113          18 - 20 years        51 - 125       42 - 106              >20 years         44 - 121       44 - 121    AST 15 0 - 40 IU/L   ALT 11 0 - 32 IU/L  Lipid panel     Status: Abnormal   Collection Time: 07/15/20  9:08 AM  Result Value Ref Range   Cholesterol, Total 184 100 - 199 mg/dL   Triglycerides 108 0 - 149 mg/dL   HDL 47 >39 mg/dL   VLDL Cholesterol Cal 20 5 - 40 mg/dL   LDL Chol Calc (NIH) 117 (H) 0 - 99 mg/dL   Chol/HDL Ratio 3.9 0.0 - 4.4 ratio    Comment:                                   T. Chol/HDL Ratio                                               Men  Women                               1/2 Avg.Risk  3.4    3.3                                   Avg.Risk  5.0    4.4                                2X Avg.Risk  9.6    7.1                                3X Avg.Risk 23.4   11.0   Hemoglobin A1c     Status: None   Collection Time: 07/15/20  9:08 AM  Result Value Ref Range   Hgb A1c MFr Bld 5.2 4.8 - 5.6 %    Comment:          Prediabetes: 5.7 - 6.4          Diabetes: >6.4          Glycemic control for adults with diabetes: <7.0    Est. average glucose Bld gHb Est-mCnc 103 mg/dL  FSH/LH     Status: None   Collection Time: 07/15/20  9:08 AM  Result Value Ref Range   LH 44.1 mIU/mL    Comment:                     Adult Female:                       Follicular phase      2.4 -  12.6                       Ovulation phase      14.0 -  95.6                       Luteal phase          1.0 -  11.4                       Postmenopausal        7.7 -  58.5    FSH 9.5 mIU/mL    Comment:                     Adult Female:                       Follicular phase      3.5 -  12.5                       Ovulation phase       4.7 -  21.5                       Luteal phase          1.7 -   7.7                        Postmenopausal  25.8 - 134.8   Estradiol     Status: None   Collection Time: 07/15/20  9:08 AM  Result Value Ref Range   Estradiol 408.0 pg/mL    Comment:                     Adult Female:                       Follicular phase   99.2 -   166.0                       Ovulation phase    85.8 -   498.0                       Luteal phase       43.8 -   211.0                       Postmenopausal     <6.0 -    54.7                     Pregnancy                       1st trimester     215.0 - >4300.0 Roche ECLIA methodology   VITAMIN D 25 Hydroxy (Vit-D Deficiency, Fractures)     Status: Abnormal   Collection Time: 07/15/20  9:08 AM  Result Value Ref Range   Vit D, 25-Hydroxy 22.9 (L) 30.0 - 100.0 ng/mL    Comment: Vitamin D deficiency has been defined by the [place] and an Endocrine Society practice guideline as a level of serum 25-OH vitamin D less than 20 ng/mL (1,2). The Endocrine Society went on to further define vitamin D insufficiency as a level between 21 and 29 ng/mL (2). 1. IOM (Institute of Medicine). 2010. Dietary reference    intakes for calcium and D. [place]: The    [employer]. 2. Holick MF, Binkley [place], Bischoff-Ferrari HA, et al.    Evaluation, treatment, and prevention of vitamin D    deficiency: an Endocrine Society clinical practice    guideline. JCEM. 2011 Jul; 96(7):1911-30.     ULTRASOUND REPORT  Location: [facility]  Date of Service: 08/18/2020     Indications:Pelvic Pain Findings:  The uterus is anteverted and measures 9.8 x 5.6 x 5.7 cm. Echo texture is homogenous without evidence of focal masses.  The Endometrium measures 14.4 mm.  Multiple nabothian cysts largest measuring 0.9 mm  Right Ovary measures 5.3 x 3.6 x 4.0 cm. It is normal in appearance. Two hypoechoic lesions 1) Complex Hypoechoic lesion with smooth walls and  good through transmission but  contains echoes through out cystic structure;  measuring 2.8 x 2.6 x 3.5 cm.  2) Simple hypoechoic lesion with smooth walls and good through transmission measuring 2.2 x 2.8 x 2.7 cm  Left Ovary measures 3.3 x 2.0 x 2.0 cm. It is normal in appearance. Survey of the adnexa demonstrates no adnexal masses. There is no free fluid in the cul de sac.  Impression: 1. Rt ovarian cysts as described above. 2. Endometrium is upper limits of 14.4 mm.  Recommendations: 1.Clinical correlation with the patient's History and Physical Exam.  Assessment:   1. Menorrhagia with regular cycle   2. Morbid obesity ([place])   3. Right ovarian cyst   Plan:   Ultrasound findings and lab results reviewed with patient, verbalized understanding.   Menorrhagia and cyst management options discussed, handouts provided. Patient desires "watchful waiting" at this time.   Reviewed red flag symptoms and when to call.   RTC x 6-8 weeks for repeat ultrasound or sooner if needed.    Dani Gobble, CNM [facility], [facility]

## 2020-08-21 DIAGNOSIS — N920 Excessive and frequent menstruation with regular cycle: Secondary | ICD-10-CM | POA: Insufficient documentation

## 2020-08-21 DIAGNOSIS — N83201 Unspecified ovarian cyst, right side: Secondary | ICD-10-CM | POA: Insufficient documentation

## 2020-10-13 ENCOUNTER — Ambulatory Visit (INDEPENDENT_AMBULATORY_CARE_PROVIDER_SITE_OTHER): Payer: Managed Care, Other (non HMO) | Admitting: Certified Nurse Midwife

## 2020-10-13 ENCOUNTER — Other Ambulatory Visit: Payer: Self-pay

## 2020-10-13 ENCOUNTER — Encounter: Payer: Self-pay | Admitting: Certified Nurse Midwife

## 2020-10-13 ENCOUNTER — Ambulatory Visit (INDEPENDENT_AMBULATORY_CARE_PROVIDER_SITE_OTHER): Payer: Managed Care, Other (non HMO)

## 2020-10-13 VITALS — BP 110/78 | Wt 288.0 lb

## 2020-10-13 DIAGNOSIS — N83201 Unspecified ovarian cyst, right side: Secondary | ICD-10-CM | POA: Diagnosis not present

## 2020-10-13 DIAGNOSIS — N920 Excessive and frequent menstruation with regular cycle: Secondary | ICD-10-CM | POA: Diagnosis not present

## 2020-10-13 MED ORDER — NORGESTIMATE-ETH ESTRADIOL 0.25-35 MG-MCG PO TABS: 1.0000 | ORAL_TABLET | Freq: Every day | ORAL | 0 refills | Status: DC

## 2020-10-13 NOTE — Progress Notes (Signed)
GYN ENCOUNTER NOTE  Subjective:       Danielle Stewart is a 39 y.o. G43P1011 female here for ultrasound review.   Last seen in office on 08/18/2020 and diagnosed with right ovarian cyst; for further details, please see previous note.   Denies difficulty breathing or respiratory distress, chest pain, abdominal pain, dysuria and leg pain or swelling.    Gynecologic History  Patient's last menstrual period was 09/30/2020.  Contraception: none  Last Pap: 08/2018. Results were: Neg/Neg  Last mammogram: 08/2018. Results were: BI-RADS 2  Obstetric History  OB History  Gravida Para Term Preterm AB Living  2 1 1   1 1   SAB TAB Ectopic Multiple Live Births  1       1    # Outcome Date GA Lbr Len/2nd Weight Sex Delivery Anes PTL Lv  2 Term 2009    F Vag-Spont   LIV  1 SAB             Past Medical History:  Diagnosis Date  . Depression   . GERD (gastroesophageal reflux disease)   . Left ovarian cyst   . Obesity     History reviewed. No pertinent surgical history.   No Known Allergies  Social History   Socioeconomic History  . Marital status: Married    Spouse name: Not on file  . Number of children: Not on file  . Years of education: Not on file  . Highest education level: Not on file  Occupational History  . Not on file  Tobacco Use  . Smoking status: Never Smoker  . Smokeless tobacco: Never Used  Vaping Use  . Vaping Use: Never used  Substance and Sexual Activity  . Alcohol use: No  . Drug use: No  . Sexual activity: Yes    Birth control/protection: None  Other Topics Concern  . Not on file  Social History Narrative  . Not on file   Social Determinants of Health   Financial Resource Strain:   . Difficulty of Paying Living Expenses: Not on file  Food Insecurity:   . Worried About 12-18-1975 in the Last Year: Not on file  . Ran Out of Food in the Last Year: Not on file  Transportation Needs:   . Lack of Transportation (Medical): Not on file  .  Lack of Transportation (Non-Medical): Not on file  Physical Activity:   . Days of Exercise per Week: Not on file  . Minutes of Exercise per Session: Not on file  Stress:   . Feeling of Stress : Not on file  Social Connections:   . Frequency of Communication with Friends and Family: Not on file  . Frequency of Social Gatherings with Friends and Family: Not on file  . Attends Religious Services: Not on file  . Active Member of Clubs or Organizations: Not on file  . Attends Programme researcher, broadcasting/film/video Meetings: Not on file  . Marital Status: Not on file  Intimate Partner Violence:   . Fear of Current or Ex-Partner: Not on file  . Emotionally Abused: Not on file  . Physically Abused: Not on file  . Sexually Abused: Not on file    Family History  Problem Relation Age of Onset  . Diabetes Mother   . Cancer Maternal Grandmother        ovarian  . Breast cancer Neg Hx     The following portions of the patient's history were reviewed and updated as appropriate: allergies, current  medications, past family history, past medical history, past social history, past surgical history and problem list.  Review of Systems  ROS negative except as noted above. Information obtained from patient.   Objective:   BP 110/78   Wt 288 lb (130.6 kg)   LMP 09/30/2020   BMI 42.53 kg/m   CONSTITUTIONAL: Well-developed, well-nourished female in no acute distress.   PHYSICAL EXAM: Not indicated.   Indications:Pelvic Pain Findings:  The uterus is anteverted and measures 10.0 x 5.5 x 6.2 cm. Echo texture is homogenous without evidence of focal masses.  The Endometrium measures 10 mm.  Multiple nabothian cysts largest measuring 0.9 mm  Right Ovary measures 5.3 x 3.6 x 4.0 cm. It is normal in appearance.  Two hypoechoic lesions:   1) Complex Hypoechoic lesion with smooth walls and  good through transmission but  contains echoes through out cystic structure; measuring 2.5 x 2.3 x 2.7 cm.   2) Simple  hypoechoic lesion with smooth walls and good through transmission measuring 1.4 x 1.6 x 1.4 cm                                                                                                                  Left Ovary measures 2.8 x 2.3 x 2.0 cm. It is normal in appearance. Survey of the adnexa demonstrates no adnexal masses. There is no free fluid in the cul de sac.  Impression: 1. Rightt ovarian cysts with minimal change from prior ultrasound..   Assessment:   1. Right ovarian cyst   Plan:   Ultrasound findings reviewed with patient, verbalized understanding.   Dr. Valentino Saxon consulted. Advised short course of OCP with follow up ultrasound in six (6) to eight (8) weeks. Patient agrees with plan of care.   Rx Sprintec, see orders.   Reviewed red flag symptoms and when to call.   RTC x 6-8 weeks for follow up ultrasound and results review or sooner if needed.    Serafina Royals, CNM [facility], [facility] 10/13/20 1:21 PM

## 2020-10-13 NOTE — Patient Instructions (Signed)
Ethinyl Estradiol; Norgestimate tablets What is this medicine? ETHINYL ESTRADIOL; NORGESTIMATE (ETH in il es tra DYE ole; nor JES ti mate) is an oral contraceptive. The products combine two types of female hormones, an estrogen and a progestin. They are used to prevent ovulation and pregnancy. Some products are also used to treat acne in females. This medicine may be used for other purposes; ask your health care provider or pharmacist if you have questions. COMMON BRAND NAME(S): Estarylla, Mili, MONO-LINYAH, MonoNessa, Norgestimate/Ethinyl Estradiol, Ortho Tri-Cyclen, Ortho Tri-Cyclen Lo, Ortho-Cyclen, Previfem, Sprintec, Tri-Estarylla, TRI-LINYAH, Tri-Lo-Estarylla, Tri-Lo-Marzia, Tri-Lo-Mili, Tri-Lo-Sprintec, Tri-Mili, Tri-Previfem, Tri-Sprintec, Tri-VyLibra, Trinessa, Trinessa Lo, VyLibra What should I tell my health care provider before I take this medicine? They need to know if you have or ever had any of these conditions:  abnormal vaginal bleeding  blood vessel disease or blood clots  breast, cervical, endometrial, ovarian, liver, or uterine cancer  diabetes  gallbladder disease  heart disease or recent heart attack  high blood pressure  high cholesterol  kidney disease  liver disease  migraine headaches  stroke  systemic lupus erythematosus (SLE)  tobacco smoker  an unusual or allergic reaction to estrogens, progestins, other medicines, foods, dyes, or preservatives  pregnant or trying to get pregnant  breast-feeding How should I use this medicine? Take this medicine by mouth. To reduce nausea, this medicine may be taken with food. Follow the directions on the prescription label. Take this medicine at the same time each day and in the order directed on the package. Do not take your medicine more often than directed. Contact your pediatrician regarding the use of this medicine in children. Special care may be needed. This medicine has been used in female children who  have started having menstrual periods. A patient package insert for the product will be given with each prescription and refill. Read this sheet carefully each time. The sheet may change frequently. Overdosage: If you think you have taken too much of this medicine contact a poison control center or emergency room at once. NOTE: This medicine is only for you. Do not share this medicine with others. What if I miss a dose? If you miss a dose, refer to the patient information sheet you received with your medicine for direction. If you miss more than one pill, this medicine may not be as effective and you may need to use another form of birth control. What may interact with this medicine? Do not take this medicine with the following medication:  dasabuvir; ombitasvir; paritaprevir; ritonavir  ombitasvir; paritaprevir; ritonavir This medicine may also interact with the following medications:  acetaminophen  antibiotics or medicines for infections, especially rifampin, rifabutin, rifapentine, and griseofulvin, and possibly penicillins or tetracyclines  aprepitant  ascorbic acid (vitamin C)  atorvastatin  barbiturate medicines, such as phenobarbital  bosentan  carbamazepine  caffeine  clofibrate  cyclosporine  dantrolene  doxercalciferol  felbamate  grapefruit juice  hydrocortisone  medicines for anxiety or sleeping problems, such as diazepam or temazepam  medicines for diabetes, including pioglitazone  mineral oil  modafinil  mycophenolate  nefazodone  oxcarbazepine  phenytoin  prednisolone  ritonavir or other medicines for HIV infection or AIDS  rosuvastatin  selegiline  soy isoflavones supplements  St. John's wort  tamoxifen or raloxifene  theophylline  thyroid hormones  topiramate  warfarin This list may not describe all possible interactions. Give your health care provider a list of all the medicines, herbs, non-prescription drugs, or  dietary supplements you use. Also tell them if  you smoke, drink alcohol, or use illegal drugs. Some items may interact with your medicine. What should I watch for while using this medicine? Visit your doctor or health care professional for regular checks on your progress. You will need a regular breast and pelvic exam and Pap smear while on this medicine. You should also discuss the need for regular mammograms with your health care professional, and follow his or her guidelines for these tests. This medicine can make your body retain fluid, making your fingers, hands, or ankles swell. Your blood pressure can go up. Contact your doctor or health care professional if you feel you are retaining fluid. Use an additional method of contraception during the first cycle that you take these tablets. If you have any reason to think you are pregnant, stop taking this medicine right away and contact your doctor or health care professional. If you are taking this medicine for hormone related problems, it may take several cycles of use to see improvement in your condition. Do not use this product if you smoke and are over 35 years of age. Smoking increases the risk of getting a blood clot or having a stroke while you are taking birth control pills, especially if you are more than 40 years old. If you are a smoker who is 35 years of age or younger, you are strongly advised not to smoke while taking birth control pills. This medicine can make you more sensitive to the sun. Keep out of the sun. If you cannot avoid being in the sun, wear protective clothing and use sunscreen. Do not use sun lamps or tanning beds/booths. If you wear contact lenses and notice visual changes, or if the lenses begin to feel uncomfortable, consult your eye care specialist. In some women, tenderness, swelling, or minor bleeding of the gums may occur. Notify your dentist if this happens. Brushing and flossing your teeth regularly may help limit  this. See your dentist regularly and inform your dentist of the medicines you are taking. If you are going to have elective surgery, you may need to stop taking this medicine before the surgery. Consult your health care professional for advice. This medicine does not protect you against HIV infection (AIDS) or any other sexually transmitted diseases. What side effects may I notice from receiving this medicine? Side effects that you should report to your doctor or health care professional as soon as possible:  breast tissue changes or discharge  changes in vaginal bleeding during your period or between your periods  chest pain  coughing up blood  dizziness or fainting spells  headaches or migraines  leg, arm or groin pain  severe or sudden headaches  stomach pain (severe)  sudden shortness of breath  sudden loss of coordination, especially on one side of the body  speech problems  symptoms of vaginal infection like itching, irritation or unusual discharge  tenderness in the upper abdomen  vomiting  weakness or numbness in the arms or legs, especially on one side of the body  yellowing of the eyes or skin Side effects that usually do not require medical attention (report to your doctor or health care professional if they continue or are bothersome):  breakthrough bleeding and spotting that continues beyond the 3 initial cycles of pills  breast tenderness  mood changes, anxiety, depression, frustration, anger, or emotional outbursts  increased sensitivity to sun or ultraviolet light  nausea  skin rash, acne, or brown spots on the skin  weight gain (slight) This   list may not describe all possible side effects. Call your doctor for medical advice about side effects. You may report side effects to FDA at 1-800-FDA-1088. Where should I keep my medicine? Keep out of the reach of children. Store at room temperature between 15 and 30 degrees C (59 and 86 degrees F).  Throw away any unused medicine after the expiration date. NOTE: This sheet is a summary. It may not cover all possible information. If you have questions about this medicine, talk to your doctor, pharmacist, or health care provider.  2020 Elsevier/Gold Standard (2016-07-09 08:09:09)   Ovarian Cyst An ovarian cyst is a fluid-filled sac on an ovary. The ovaries are organs that make eggs in women. Most ovarian cysts go away on their own and are not cancerous (are benign). Some cysts need treatment. Follow these instructions at home:  Take over-the-counter and prescription medicines only as told by your doctor.  Do not drive or use heavy machinery while taking prescription pain medicine.  Get pelvic exams and Pap tests as often as told by your doctor.  Return to your normal activities as told by your doctor. Ask your doctor what activities are safe for you.  Do not use any products that contain nicotine or tobacco, such as cigarettes and e-cigarettes. If you need help quitting, ask your doctor.  Keep all follow-up visits as told by your doctor. This is important. Contact a doctor if:  Your periods are: ? Late. ? Irregular. ? Painful.   Your periods stop.  You have pelvic pain that does not go away.  You have pressure on your bladder.  You have trouble making your bladder empty when you pee (urinate).  You have pain during sex.  You have any of the following in your belly (abdomen): ? A feeling of fullness. ? Pressure. ? Discomfort. ? Pain that does not go away. ? Swelling.  You feel sick most of the time.  You have trouble pooping (have constipation).  You are not as hungry as usual (you lose your appetite).  You get very bad acne.  You start to have more hair on your body and face.  You are gaining weight or losing weight without changing your exercise and eating habits.  You think you may be pregnant. Get help right away if:  You have belly pain that is very  bad or gets worse.  You cannot eat or drink without throwing up (vomiting).  You suddenly get a fever.  Your period is a lot heavier than usual. This information is not intended to replace advice given to you by your health care provider. Make sure you discuss any questions you have with your health care provider. Document Revised: 10/11/2017 Document Reviewed: 04/01/2016 Elsevier Patient Education  2020 [employer].

## 2020-10-13 NOTE — Progress Notes (Signed)
Pt present for u/s review. No complaints. 

## 2020-12-08 ENCOUNTER — Encounter: Payer: Managed Care, Other (non HMO) | Admitting: Certified Nurse Midwife

## 2020-12-08 ENCOUNTER — Other Ambulatory Visit: Payer: Managed Care, Other (non HMO)

## 2020-12-15 ENCOUNTER — Ambulatory Visit (INDEPENDENT_AMBULATORY_CARE_PROVIDER_SITE_OTHER): Payer: Managed Care, Other (non HMO)

## 2020-12-15 ENCOUNTER — Ambulatory Visit (INDEPENDENT_AMBULATORY_CARE_PROVIDER_SITE_OTHER): Payer: Managed Care, Other (non HMO) | Admitting: Certified Nurse Midwife

## 2020-12-15 ENCOUNTER — Encounter: Payer: Self-pay | Admitting: Certified Nurse Midwife

## 2020-12-15 ENCOUNTER — Other Ambulatory Visit: Payer: Self-pay

## 2020-12-15 VITALS — BP 120/78 | Ht 69.0 in | Wt 292.6 lb

## 2020-12-15 DIAGNOSIS — N83202 Unspecified ovarian cyst, left side: Secondary | ICD-10-CM | POA: Diagnosis not present

## 2020-12-15 DIAGNOSIS — N83201 Unspecified ovarian cyst, right side: Secondary | ICD-10-CM

## 2020-12-15 DIAGNOSIS — Z8616 Personal history of COVID-19: Secondary | ICD-10-CM

## 2020-12-15 NOTE — Progress Notes (Signed)
GYN ENCOUNTER NOTE  Subjective:       Danielle Stewart is a 40 y.o. G61P1011 female here for ultrasound review and follow up.   Last seen in office on 10/13/2020 for management of ovarian cyst, taking ortho cyclen for the last six (6) weeks. Reports last period was "horrible".   History of COVID-19, requests antibody testing.   Denies difficulty breathing or respiratory distress, chest pain, dysuria, and leg pain or swelling.    Gynecologic History  Patient's last menstrual period was 11/22/2020 (exact date).  Contraception: none  Last Pap: 08/2018. Results were: Neg/Neg  Last mammogram: 08/2018. Results were: BI-RADS 2  Obstetric History OB History  Gravida Para Term Preterm AB Living  2 1 1   1 1   SAB IAB Ectopic Multiple Live Births  1       1    # Outcome Date GA Lbr Len/2nd Weight Sex Delivery Anes PTL Lv  2 SAB 2012 [redacted]w[redacted]d         1 Term 10/27/08 [redacted]w[redacted]d  8 lb 1 oz (3.657 kg) F Vag-Spont EPI N LIV     Complications: Anesthetic Complications    Past Medical History:  Diagnosis Date   Depression    GERD (gastroesophageal reflux disease)    Left ovarian cyst    Obesity     No past surgical history on file.  Current Outpatient Medications on File Prior to Visit  Medication Sig Dispense Refill   norgestimate-ethinyl estradiol (ORTHO-CYCLEN) 0.25-35 MG-MCG tablet Take 1 tablet by mouth daily. 56 tablet 0   No current facility-administered medications on file prior to visit.    No Known Allergies  Social History   Socioeconomic History   Marital status: Married    Spouse name: Not on file   Number of children: Not on file   Years of education: Not on file   Highest education level: Not on file  Occupational History   Not on file  Tobacco Use   Smoking status: Never Smoker   Smokeless tobacco: Never Used  Vaping Use   Vaping Use: Never used  Substance and Sexual Activity   Alcohol use: No   Drug use: No   Sexual activity: Yes    Birth  control/protection: Pill  Other Topics Concern   Not on file  Social History Narrative   Not on file   Social Determinants of Health   Financial Resource Strain: Not on file  Food Insecurity: Not on file  Transportation Needs: Not on file  Physical Activity: Not on file  Stress: Not on file  Social Connections: Not on file  Intimate Partner Violence: Not on file    Family History  Problem Relation Age of Onset   Diabetes Mother    Cancer Maternal Grandmother        ovarian   Breast cancer Neg Hx     The following portions of the patient's history were reviewed and updated as appropriate: allergies, current medications, past family history, past medical history, past social history, past surgical history and problem list.  Review of Systems  ROS negative except as noted above. Information obtained from patient.   Objective:   BP 120/78    Ht 5\' 9"  (1.753 m)    Wt 292 lb 9 oz (132.7 kg)    LMP 11/22/2020 (Exact Date)    BMI 43.20 kg/m    CONSTITUTIONAL: Well-developed, well-nourished female in no acute distress.   PHYSICAL EXAM: Not indicated.   Indications:Pelvic Pain Findings: The  uterus is antevertedand measures 10.0 x 5.5 x 6.2 cm. Echo texture is homogenouswithoutevidence of focal masses.  The Endometriummeasures 10 mm.  Multiple nabothiancysts largest measuring 0.9 mm  Right Ovary measures 5.3 x 3.6 x 4.0 cm. It is normal in appearance  Complex Hypoechoic lesion with smooth walls and good through transmission but contains echoes through out cystic structure; measuring 2.5x 1.8 x 2.1 cm.  Left Ovary measures 2.8 x 2.3 x 2.0 cm. It is normal in appearance. Hypoechoic lesion with smooth walls and good through transmission with thin septation measuring 1.2 x 41.4 x 1.0 cm  There is no free fluid in the cul de sac.  Impression: 1. Rightt ovarian cyst with minimal change from prior ultrasound as mention above. 2. Lt ovarian  cyst  as noted above.   Assessment:   1. Cysts of both ovaries   Plan:   Ultrasound findings reviewed with patient, verbalized understanding.   Options for continuing pills continuously, changing pill or following up in a few months given.   Patient agrees to continuous use of pills for the next two (2) to three (3) months will follow up.   Labs today, see orders.   Reviewed red flag symptoms and when to call.   RTC x 8-12 weeks for ultrasound and follow up or sooner if needed.    Serafina Royals, CNM [facility], [facility]

## 2020-12-15 NOTE — Patient Instructions (Signed)
Ovarian Cyst  An ovarian cyst is a fluid-filled sac on an ovary. Most of these cysts go away on their own and are not cancer. Some cysts need treatment. What are the causes?  Ovarian hyperstimulation syndrome. Some medicines may lead to this problem.  Polycystic ovarian syndrome (PCOS). Problems with body chemicals (hormones) can lead to this condition.  The normal menstrual cycle. What increases the risk?  Being overweight or very overweight.  Taking medicines to increase your chance of getting pregnant.  Using some types of birth control.  Smoking. What are the signs or symptoms? Many ovarian cysts do not cause symptoms. If you get symptoms, you may have:  Pain or pressure in the area between the hip bones.  Pain in the lower belly.  Pain during sex.  Swelling in the lower belly.  Periods that are not regular.  Pain with periods. How is this treated? Many ovarian cysts go away on their own without treatment. If you need treatment, it may include:  Medicines for pain.  Fluid taken out of the cyst.  The cyst being taken out.  Birth control pills or other medicines.  Surgery to remove the ovary. Follow these instructions at home:  Take over-the-counter and prescription medicines only as told by your doctor.  Ask your doctor if you should avoid driving or using machines while you are taking your medicine.  Get exams and Pap tests as told by your doctor.  Return to your normal activities when your doctor says that it is safe.  Do not smoke or use any products that contain nicotine or tobacco. If you need help quitting, ask your doctor.  Keep all follow-up visits. Contact a doctor if:  Your periods: ? Are late. ? Are not regular. ? Stop. ? Are painful.  You have pain in the area between your hip bones, and the pain does not go away.  You feel pressure on your bladder.  You have trouble peeing.  You feel full, or your belly hurts, swells, or  bloats.  You gain or lose weight without trying, or you are less hungry than normal.  You feel pain and pressure in your back.  You feel pain and pressure in the area between your hip bones.  You think you may be pregnant. Get help right away if:  You have pain in your belly that is very bad or gets worse.  You have pain in the area between your hip bones, and the pain is very bad or gets worse.  You cannot eat or drink without vomiting.  You get a fever or chills all of a sudden.  Your period is a lot heavier than usual. Summary  An ovarian cyst is a fluid-filled sac on an ovary.  Some cysts may cause problems and need treatment.  Most of these cysts go away on their own. This information is not intended to replace advice given to you by your health care provider. Make sure you discuss any questions you have with your health care provider. Document Revised: 04/07/2020 Document Reviewed: 04/07/2020 Elsevier Patient Education  2021 [employer]  

## 2020-12-16 ENCOUNTER — Other Ambulatory Visit: Payer: Self-pay | Admitting: Certified Nurse Midwife

## 2020-12-16 DIAGNOSIS — Z8616 Personal history of COVID-19: Secondary | ICD-10-CM | POA: Insufficient documentation

## 2020-12-16 LAB — SARS-COV-2 ANTIBODIES: SARS-CoV-2 Antibodies: POSITIVE

## 2020-12-16 LAB — SARS-COV-2 ANTIBODY, IGM: SARS-CoV-2 Antibody, IgM: NEGATIVE

## 2021-01-13 ENCOUNTER — Other Ambulatory Visit: Payer: Self-pay | Admitting: Certified Nurse Midwife

## 2021-01-13 DIAGNOSIS — N83201 Unspecified ovarian cyst, right side: Secondary | ICD-10-CM

## 2021-02-14 ENCOUNTER — Ambulatory Visit
Admission: RE | Admit: 2021-02-14 | Discharge: 2021-02-14 | Disposition: A | Discharge status: Home / Self Care | Payer: Managed Care, Other (non HMO) | Source: Ambulatory Visit | Attending: Certified Nurse Midwife | Admitting: Certified Nurse Midwife

## 2021-02-14 ENCOUNTER — Other Ambulatory Visit: Payer: Self-pay

## 2021-02-14 DIAGNOSIS — N83201 Unspecified ovarian cyst, right side: Secondary | ICD-10-CM | POA: Insufficient documentation

## 2021-02-16 ENCOUNTER — Other Ambulatory Visit: Payer: Self-pay

## 2021-02-16 ENCOUNTER — Encounter: Payer: Self-pay | Admitting: Certified Nurse Midwife

## 2021-02-16 ENCOUNTER — Ambulatory Visit: Payer: Managed Care, Other (non HMO) | Admitting: Certified Nurse Midwife

## 2021-02-16 ENCOUNTER — Other Ambulatory Visit: Payer: Managed Care, Other (non HMO)

## 2021-02-16 VITALS — BP 110/60 | HR 80 | Ht 69.0 in | Wt 292.0 lb

## 2021-02-16 DIAGNOSIS — N83201 Unspecified ovarian cyst, right side: Secondary | ICD-10-CM

## 2021-02-16 DIAGNOSIS — N80129 Deep endometriosis of ovary, unspecified ovary: Secondary | ICD-10-CM

## 2021-02-16 DIAGNOSIS — N801 Endometriosis of ovary: Secondary | ICD-10-CM | POA: Diagnosis not present

## 2021-02-16 NOTE — Patient Instructions (Addendum)
Ovarian Cyst  An ovarian cyst is a fluid-filled sac that forms on an ovary. The ovaries are small organs that produce eggs in women. Various types of cysts can form on the ovaries. Some may cause symptoms and require treatment. Most ovarian cysts go away on their own, are not cancerous (are benign), and do not cause problems. What are the causes? Ovarian cysts may be caused by:  Ovarian hyperstimulation syndrome. This is a condition that can develop from taking fertility medicines. It causes multiple large ovarian cysts to form.  Polycystic ovarian syndrome (PCOS). This is a common hormonal disorder that can cause ovarian cysts to form, and can cause problems with your period or fertility.  The normal menstrual cycle. What increases the risk? The following factors may make you more likely to develop this condition:  Being overweight or obese.  Taking fertility medicines.  Taking certain forms of hormonal birth control.  Smoking. What are the signs or symptoms? Many ovarian cysts do not cause symptoms. If symptoms are present, they may include:  Pelvic pain or pressure.  Pain in the lower abdomen.  Pain during sex.  Abdominal swelling.  Abnormal menstrual periods.  Increasing pain with menstrual periods. How is this diagnosed? These cysts are commonly found during a routine pelvic exam. You may have tests to find out more about the cyst, such as:  Ultrasound.  CT scan.  MRI.  Blood tests. How is this treated? Many ovarian cysts go away on their own without treatment. Your health care provider may want to check your cyst regularly for 2-3 months to see if it changes. If you are in menopause, it is especially important to have your cyst monitored closely because menopausal women have a higher rate of ovarian cancer. When treatment is needed, it may include:  Medicines to help relieve pain.  A procedure to drain the cyst (aspiration).  Surgery to remove the whole cyst  (cystectomy).  Hormone treatment or birth control pills. These methods are sometimes used to help keep cysts from coming back.  Surgery to remove the ovary (oophorectomy). Follow these instructions at home:  Take over-the-counter and prescription medicines only as told by your health care provider.  Ask your health care provider if any medicine prescribed to you requires you to avoid driving or using machinery.  Get regular pelvic exams and Pap tests as often as told by your health care provider.  Return to your normal activities as told by your health care provider. Ask your health care provider what activities are safe for you.  Do not use any products that contain nicotine or tobacco, such as cigarettes, e-cigarettes, and chewing tobacco. If you need help quitting, ask your health care provider.  Keep all follow-up visits. This is important. Contact a health care provider if:  Your periods are late, irregular, painful, or they stop.  You have pelvic pain that does not go away.  You have pressure on your bladder or trouble emptying your bladder completely.  You have any of the following: ? A feeling of fullness. ? You are gaining weight or losing weight without changing your exercise and eating habits. ? Pain, swelling, or bloating in the abdomen. ? Loss of appetite. ? Pain and pressure in your back and pelvis.  You think you may be pregnant. Get help right away if:  You have abdominal or pelvic pain that is severe or gets worse.  You cannot eat or drink without vomiting.  You suddenly develop a fever   or chills.  Your menstrual period is much heavier than usual. Summary  An ovarian cyst is a fluid-filled sac that forms on an ovary.  Some ovarian cysts may cause symptoms and require treatment.  These cysts are commonly found during a routine pelvic exam.  Many ovarian cysts go away on their own without treatment. This information is not intended to replace advice  given to you by your health care provider. Make sure you discuss any questions you have with your health care provider. Document Revised: 04/07/2020 Document Reviewed: 04/07/2020 Elsevier Patient Education  2021 Else[employer] Inc.  Tranexamic acid oral tablets What is this medicine? TRANEXAMIC ACID (TRAN ex AM ik AS id) slows down or stops blood clots from being broken down. This medicine is used to treat heavy monthly menstrual bleeding. This medicine may be used for other purposes; ask your health care provider or pharmacist if you have questions. COMMON BRAND NAME(S): Cyklokapron, Lysteda What should I tell my health care provider before I take this medicine? They need to know if you have any of these conditions:  bleeding in the brain  blood clotting problems  kidney disease  vision problems  an unusual allergic reaction to tranexamic acid, other medicines, foods, dyes, or preservatives  pregnant or trying to get pregnant  breast-feeding How should I use this medicine? Take this medicine by mouth with a glass of water. Follow the directions on the prescription label. Do not cut, crush, or chew this medicine. You can take it with or without food. If it upsets your stomach, take it with food. Take your medicine at regular intervals. Do not take it more often than directed. Do not stop taking except on your doctor's advice. Do not take this medicine until your period has started. Do not take it for more than 5 days in a row. Do not take this medicine when you do not have your period. Talk to your pediatrician regarding the use of this medicine in children. While this drug may be prescribed for female children as young as 17 years of age for selected conditions, precautions do apply. Overdosage: If you think you have taken too much of this medicine contact a poison control center or emergency room at once. NOTE: This medicine is only for you. Do not share this medicine with others. What if  I miss a dose? If you miss a dose, take it when you remember, and then take your next dose at least 6 hours later. Do not take more than 2 tablets at a time to make up for missed doses. What may interact with this medicine? Do not take this medicine with any of the following medications:  estrogens  birth control pills, patches, injections, rings or other devices that contain both an estrogen and a progestin This medicine may also interact with the following medications:  certain medicines used to help your blood clot  tretinoin (taken by mouth) This list may not describe all possible interactions. Give your health care provider a list of all the medicines, herbs, non-prescription drugs, or dietary supplements you use. Also tell them if you smoke, drink alcohol, or use illegal drugs. Some items may interact with your medicine. What should I watch for while using this medicine? Tell your doctor or healthcare professional if your symptoms do not start to get better or if they get worse. Tell your doctor or healthcare professional if you notice any eye problems while taking this medicine. Your doctor will refer you to an  eye doctor who will examine your eyes. What side effects may I notice from receiving this medicine? Side effects that you should report to your doctor or health care professional as soon as possible:  allergic reactions like skin rash, itching or hives, swelling of the face, lips, or tongue  breathing difficulties  changes in vision  sudden or severe pain in the chest, legs, head, or groin  unusually weak or tired Side effects that usually do not require medical attention (report to your doctor or health care professional if they continue or are bothersome):  back pain  headache  muscle or joint aches  sinus and nasal problems  stomach pain  tiredness This list may not describe all possible side effects. Call your doctor for medical advice about side effects.  You may report side effects to FDA at 1-800-FDA-1088. Where should I keep my medicine? Keep out of the reach of children. Store at room temperature between 15 and 30 degrees C (59 and 86 degrees F). Throw away any unused medicine after the expiration date. NOTE: This sheet is a summary. It may not cover all possible information. If you have questions about this medicine, talk to your doctor, pharmacist, or health care provider.  2021 Elsevier/Gold Standard (2015-12-01 09:12:15)

## 2021-02-16 NOTE — Progress Notes (Signed)
GYN ENCOUNTER NOTE  Subjective:       Danielle Stewart is a 40 y.o. G39P1011 female here for ultrasound review.   Previously diagnosed with bilateral ovarian cysts taking OCPs for management; for further details, please see previous notes.   Denies difficulty breathing or respiratory distress, chest pain, abdominal pain, dysuria, and leg pain or swelling.    Gynecologic History  Patient's last menstrual period was 02/10/2021 (within days). Period Duration (Days): 6-8 Period Pattern: Regular Menstrual Flow: Heavy Menstrual Control: Maxi pad Menstrual Control Change Freq (Hours): 2-3 Dysmenorrhea: (!) Moderate Dysmenorrhea Symptoms: Cramping,Headache  Contraception: OCP (estrogen/progesterone)  Last Pap: 08/2018. Results were: Neg/Neg  Last mammogram: 08/2018. Results were: BI-RADS 2  Obstetric History  OB History  Gravida Para Term Preterm AB Living  2 1 1   1 1   SAB IAB Ectopic Multiple Live Births  1       1    # Outcome Date GA Lbr Len/2nd Weight Sex Delivery Anes PTL Lv  2 SAB 2012 [redacted]w[redacted]d         1 Term 10/27/08 [redacted]w[redacted]d  8 lb 1 oz (3.657 kg) F Vag-Spont EPI N LIV     Complications: Anesthetic Complications    Past Medical History:  Diagnosis Date  . Depression   . GERD (gastroesophageal reflux disease)   . Left ovarian cyst   . Obesity     History reviewed. No pertinent surgical history.  Current Outpatient Medications on File Prior to Visit  Medication Sig Dispense Refill  . norgestimate-ethinyl estradiol (ORTHO-CYCLEN) 0.25-35 MG-MCG tablet TAKE 1 TABLET BY MOUTH EVERY DAY 28 tablet 7   No current facility-administered medications on file prior to visit.    No Known Allergies  Social History   Socioeconomic History  . Marital status: Married    Spouse name: Not on file  . Number of children: Not on file  . Years of education: Not on file  . Highest education level: Not on file  Occupational History  . Not on file  Tobacco Use  . Smoking status:  Never Smoker  . Smokeless tobacco: Never Used  Vaping Use  . Vaping Use: Never used  Substance and Sexual Activity  . Alcohol use: No  . Drug use: No  . Sexual activity: Yes    Birth control/protection: Pill  Other Topics Concern  . Not on file  Social History Narrative  . Not on file   Social Determinants of Health   Financial Resource Strain: Not on file  Food Insecurity: Not on file  Transportation Needs: Not on file  Physical Activity: Not on file  Stress: Not on file  Social Connections: Not on file  Intimate Partner Violence: Not on file    Family History  Problem Relation Age of Onset  . Diabetes Mother   . Cancer Maternal Grandmother        ovarian  . Breast cancer Neg Hx     The following portions of the patient's history were reviewed and updated as appropriate: allergies, current medications, past family history, past medical history, past social history, past surgical history and problem list.  Review of Systems  ROS negative except as noted above. Information obtained from patient.   Objective:   BP 110/60   Pulse 80   Ht 5\' 9"  (1.753 m)   Wt 292 lb (132.5 kg)   LMP 02/10/2021 (Within Days)   BMI 43.12 kg/m    CONSTITUTIONAL: Well-developed, well-nourished female in no acute distress.  PHYSICAL EXAM: Not indicated.    EXAM: TRANSABDOMINAL AND TRANSVAGINAL ULTRASOUND OF PELVIS  Location: [facility]  TECHNIQUE: Both transabdominal and transvaginal ultrasound examinations of the pelvis were performed. Transabdominal technique was performed for global imaging of the pelvis including uterus, ovaries, adnexal regions, and pelvic cul-de-sac. It was necessary to proceed with endovaginal exam following the transabdominal exam to visualize the uterus, endometrium, and ovaries.  COMPARISON:  Prior ultrasound from 12/15/2020.  FINDINGS: Uterus  Measurements: 10.4 x 5.4 x 6.6 cm = volume: 196 mL. Uterus is anteverted. No discrete fibroid or other  mass. Few small nabothian cysts noted about the cervix.  Endometrium  Thickness: 5 mm.  No focal abnormality visualized.  Right ovary  Measurements: 4.2 x 2.8 x 3.2 cm = volume: 19.4 mL. There is a persistent hypoechoic cystic lesion measuring 2.0 x 1.9 x 1.9 cm, little interval change from previous. Lesion demonstrates fairly homogeneous low-level internal echoes without internal vascularity or solid component. Increased vascularity seen about the periphery of this lesion. Given the persistence of this finding, an endometrioma could be considered.  Left ovary  Measurements: 3.1 x 2.3 x 2.1 cm = volume: 7.8 mL. Normal appearance/no adnexal mass.  Other findings  No abnormal free fluid.  IMPRESSION: 1. Persistent 2 cm hypoechoic complex right ovarian cystic lesion, relatively unchanged from previous. An endometrioma is favored given the persistence of this finding. 2. Otherwise unremarkable and normal pelvic ultrasound.   Electronically Signed   By: Rise Mu M.D.   On: 02/15/2021 02:50    Assessment:   1. Right ovarian cyst   2. Endometrioma of ovary   Plan:   Ultrasound findings discussed with patient, verbalized understanding.   UpToDate handout provided with management options, patient agrees to review and contact CNM with desired plan of care.   Reviewed red flag symptoms and when to call.   RTC x 5-6 months for [facility] or sooner if needed.    Serafina Royals, CNM [facility], [facility] 02/16/21 4:30 PM

## 2021-02-16 NOTE — Progress Notes (Signed)
GYN-Pt present for ultrasound results.

## 2021-04-27 IMAGING — US US PELVIS COMPLETE WITH TRANSVAGINAL
1 series · 13 of 25 positions shown · non-contrast
Comparison: Prior ultrasound from 12/15/2020.

CLINICAL DATA: Initial evaluation for right ovarian cyst.



[Series 1: us pelvic complete with transvaginal · 13 of 163 slices shown]
[im 1/163]
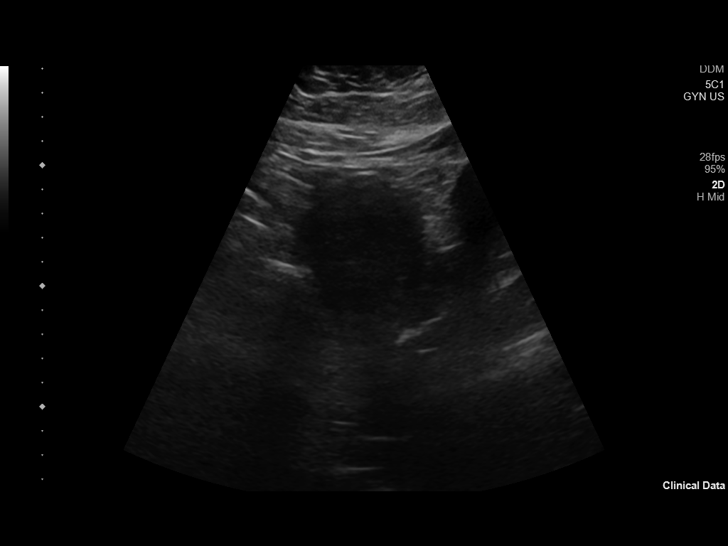
[im 14/163]
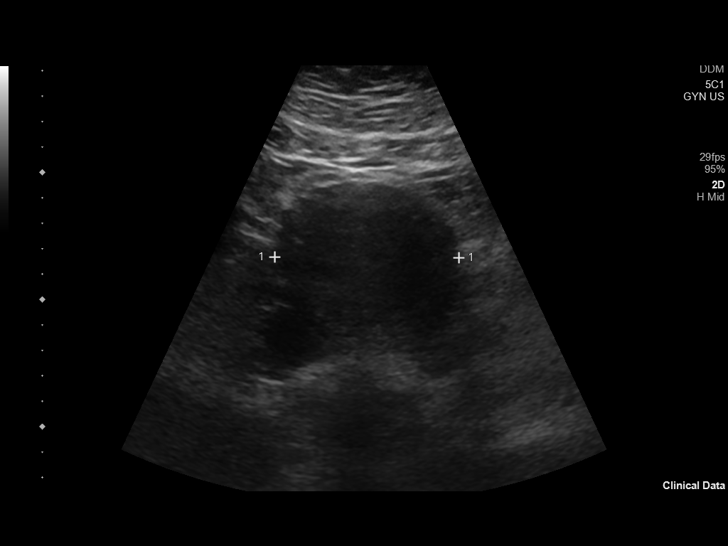
[im 28/163]
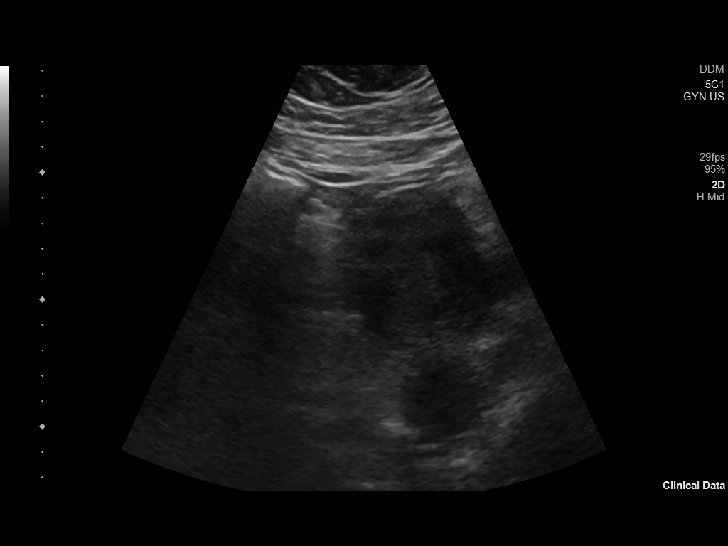
[im 41/163]
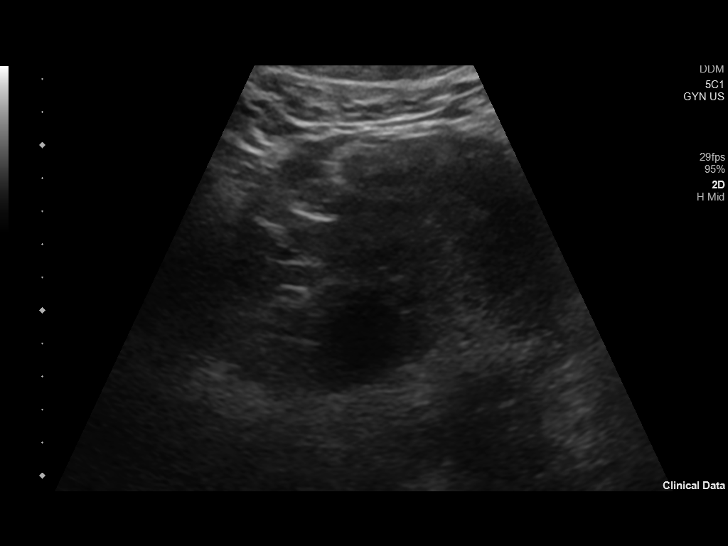
[im 55/163]
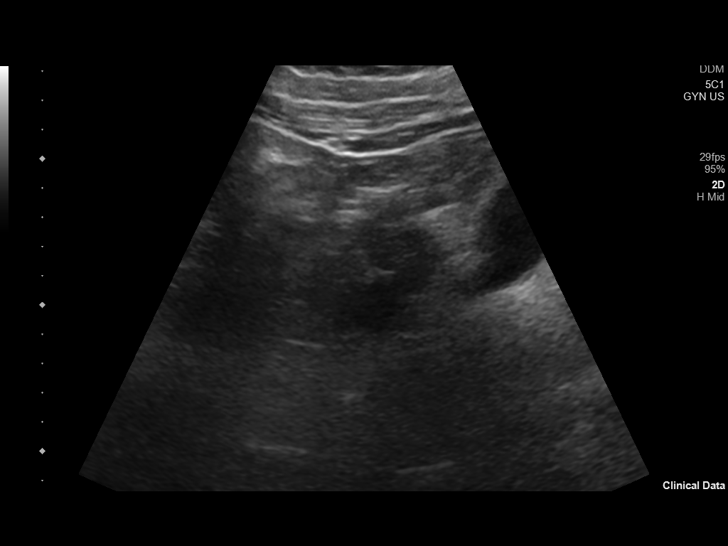
[im 68/163]
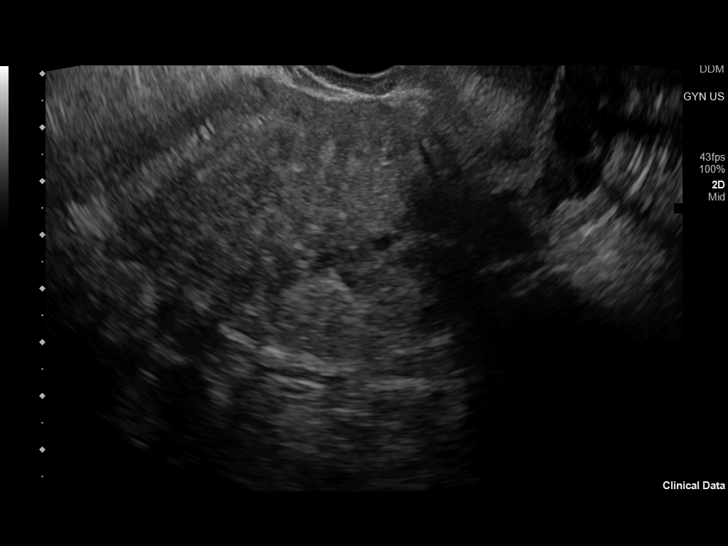
[im 82/163]
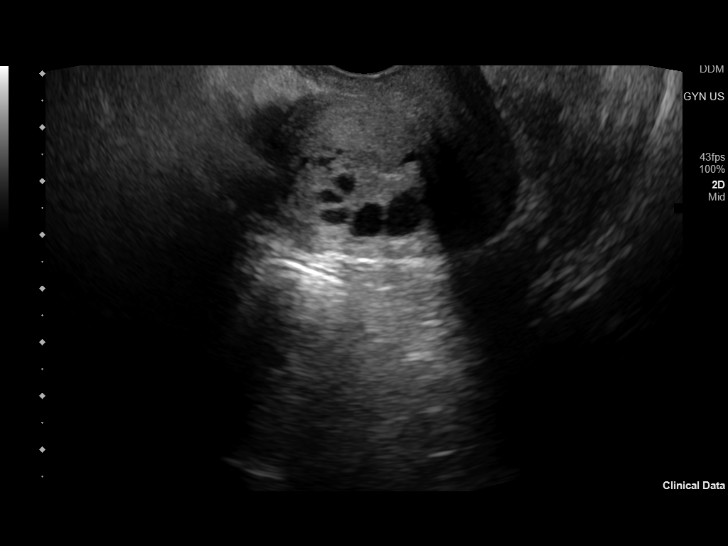
[im 95/163]
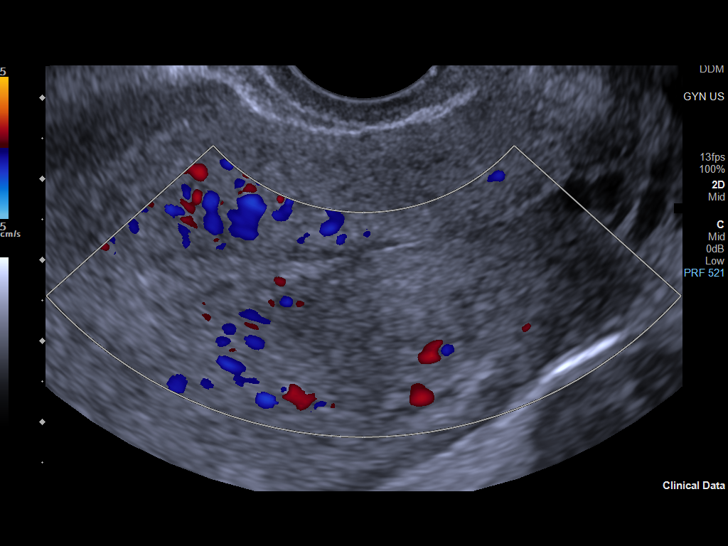
[im 109/163]
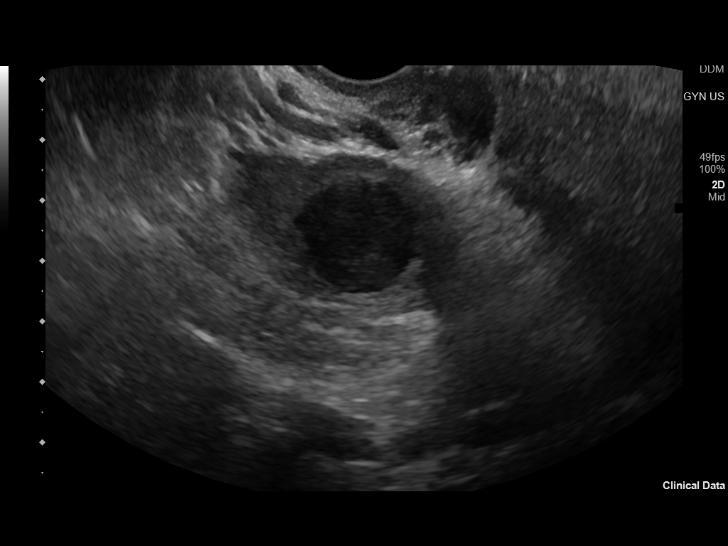
[im 122/163]
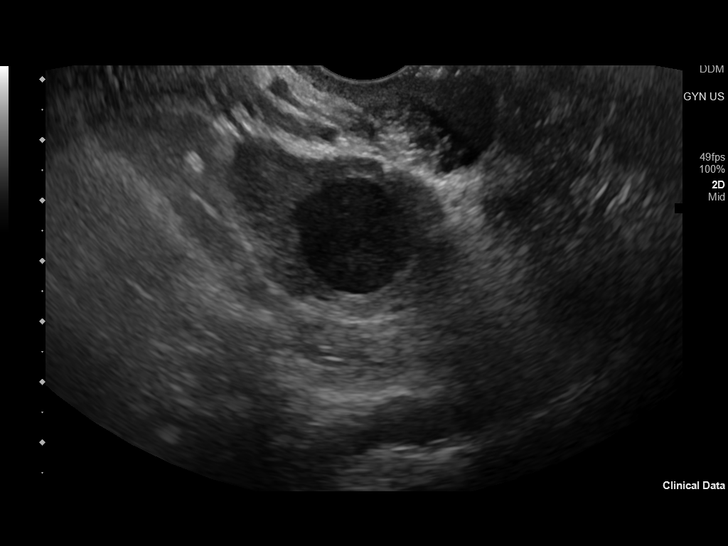
[im 136/163]
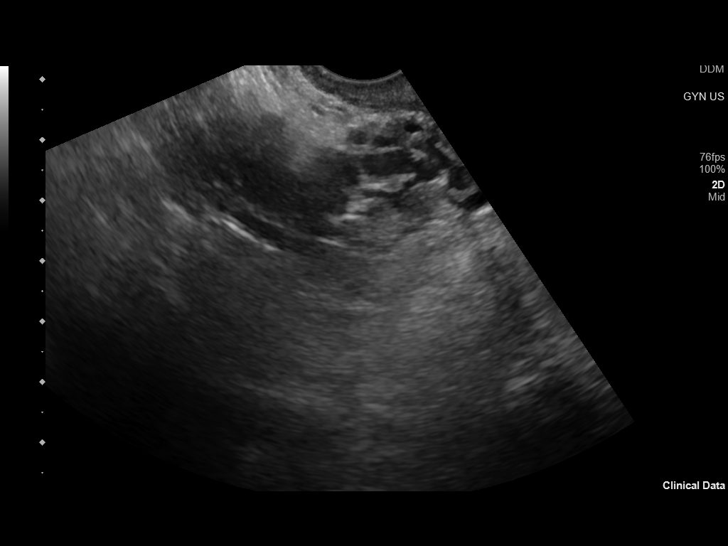
[im 149/163]
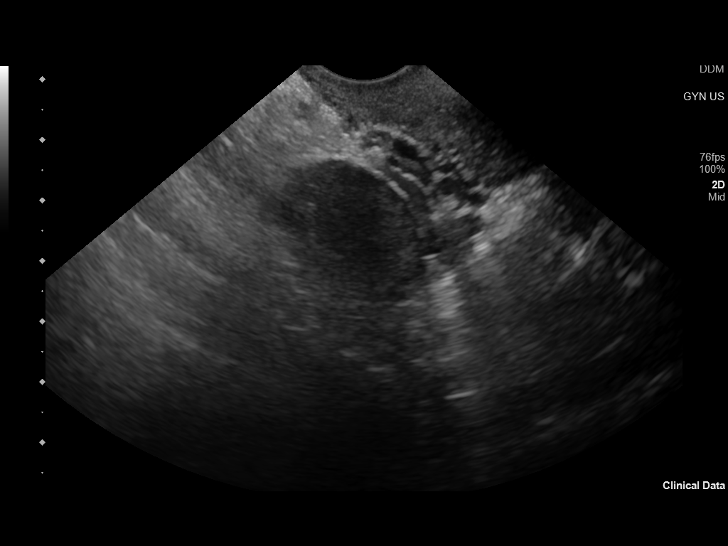
[im 163/163]
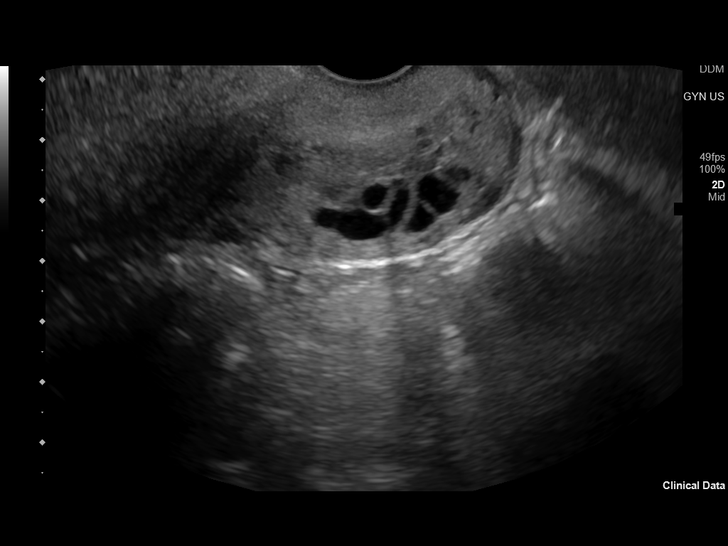

[13 of 25 positions shown; findings below may reference images not displayed]

FINDINGS: Uterus

Measurements: 10.4 x 5.4 x 6.6 cm = volume: 196 mL. Uterus is
anteverted. No discrete fibroid or other mass. Few small nabothian
cysts noted about the cervix.

Endometrium

Thickness: 5 mm.  No focal abnormality visualized.

Right ovary

Measurements: 4.2 x 2.8 x 3.2 cm = volume: 19.4 mL. There is a
persistent hypoechoic cystic lesion measuring 2.0 x 1.9 x 1.9 cm,
little interval change from previous. Lesion demonstrates fairly
homogeneous low-level internal echoes without internal vascularity
or solid component. Increased vascularity seen about the periphery
of this lesion. Given the persistence of this finding, an
endometrioma could be considered.

Left ovary

Measurements: 3.1 x 2.3 x 2.1 cm = volume: 7.8 mL. Normal
appearance/no adnexal mass.

Other findings

No abnormal free fluid.
IMPRESSION: 1. Persistent 2 cm hypoechoic complex right ovarian cystic lesion,
relatively unchanged from previous. An endometrioma is favored given
the persistence of this finding.
2. Otherwise unremarkable and normal pelvic ultrasound.

## 2021-07-24 ENCOUNTER — Encounter: Payer: Managed Care, Other (non HMO) | Admitting: Certified Nurse Midwife

## 2022-10-30 ENCOUNTER — Other Ambulatory Visit: Payer: Self-pay | Admitting: Family Medicine

## 2022-10-30 DIAGNOSIS — Z1231 Encounter for screening mammogram for malignant neoplasm of breast: Secondary | ICD-10-CM

## 2023-03-27 ENCOUNTER — Ambulatory Visit
Admission: RE | Admit: 2023-03-27 | Discharge: 2023-03-27 | Disposition: A | Discharge status: Home / Self Care | Payer: Managed Care, Other (non HMO) | Source: Ambulatory Visit | Attending: Family Medicine | Admitting: Family Medicine

## 2023-03-27 DIAGNOSIS — Z1231 Encounter for screening mammogram for malignant neoplasm of breast: Secondary | ICD-10-CM | POA: Diagnosis present
